# Patient Record
Sex: Female | Born: 1960 | Race: White | Hispanic: No | Marital: Married | Smoking: Never smoker
Health system: Southern US, Community
[De-identification: ages and names within clinical notes are randomized; demographics above are authoritative.]

## PROBLEM LIST (undated history)

## (undated) DIAGNOSIS — Z90710 Acquired absence of both cervix and uterus: Secondary | ICD-10-CM

## (undated) DIAGNOSIS — Z872 Personal history of diseases of the skin and subcutaneous tissue: Secondary | ICD-10-CM

## (undated) DIAGNOSIS — K219 Gastro-esophageal reflux disease without esophagitis: Secondary | ICD-10-CM

## (undated) DIAGNOSIS — Z9889 Other specified postprocedural states: Secondary | ICD-10-CM

## (undated) DIAGNOSIS — T7840XA Allergy, unspecified, initial encounter: Secondary | ICD-10-CM

## (undated) DIAGNOSIS — Z79890 Hormone replacement therapy (postmenopausal): Secondary | ICD-10-CM

## (undated) DIAGNOSIS — E039 Hypothyroidism, unspecified: Secondary | ICD-10-CM

## (undated) DIAGNOSIS — E079 Disorder of thyroid, unspecified: Secondary | ICD-10-CM

## (undated) HISTORY — DX: Allergy, unspecified, initial encounter: T78.40XA

## (undated) HISTORY — DX: Personal history of diseases of the skin and subcutaneous tissue: Z87.2

## (undated) HISTORY — DX: Gastro-esophageal reflux disease without esophagitis: K21.9

## (undated) HISTORY — DX: Acquired absence of both cervix and uterus: Z90.710

## (undated) HISTORY — DX: Hormone replacement therapy: Z79.890

## (undated) HISTORY — DX: Other specified postprocedural states: Z98.890

## (undated) HISTORY — PX: BREAST SURGERY: SHX581

## (undated) HISTORY — DX: Hypothyroidism, unspecified: E03.9

---

## 1997-01-10 HISTORY — PX: BREAST EXCISIONAL BIOPSY: SUR124

## 1997-05-23 ENCOUNTER — Other Ambulatory Visit: Admission: RE | Admit: 1997-05-23 | Discharge: 1997-05-23 | Payer: Self-pay | Admitting: General Surgery

## 1997-12-26 ENCOUNTER — Other Ambulatory Visit: Admission: RE | Admit: 1997-12-26 | Discharge: 1997-12-26 | Payer: Self-pay | Admitting: Obstetrics & Gynecology

## 1998-12-21 ENCOUNTER — Other Ambulatory Visit: Admission: RE | Admit: 1998-12-21 | Discharge: 1998-12-21 | Payer: Self-pay | Admitting: Obstetrics & Gynecology

## 1998-12-22 ENCOUNTER — Other Ambulatory Visit: Admission: RE | Admit: 1998-12-22 | Discharge: 1998-12-22 | Payer: Self-pay | Admitting: Obstetrics & Gynecology

## 1999-12-30 ENCOUNTER — Other Ambulatory Visit: Admission: RE | Admit: 1999-12-30 | Discharge: 1999-12-30 | Payer: Self-pay | Admitting: Obstetrics & Gynecology

## 2000-12-26 ENCOUNTER — Other Ambulatory Visit: Admission: RE | Admit: 2000-12-26 | Discharge: 2000-12-26 | Payer: Self-pay | Admitting: Obstetrics & Gynecology

## 2009-04-08 HISTORY — PX: ABDOMINAL HYSTERECTOMY: SHX81

## 2013-12-02 ENCOUNTER — Encounter: Payer: Self-pay | Admitting: Emergency Medicine

## 2013-12-02 ENCOUNTER — Emergency Department
Admission: EM | Admit: 2013-12-02 | Discharge: 2013-12-02 | Disposition: A | Discharge status: Home / Self Care | Payer: 59 | Source: Home / Self Care | Attending: Family Medicine | Admitting: Family Medicine

## 2013-12-02 DIAGNOSIS — H6121 Impacted cerumen, right ear: Secondary | ICD-10-CM

## 2013-12-02 HISTORY — DX: Disorder of thyroid, unspecified: E07.9

## 2013-12-02 MED ORDER — NEOMYCIN-POLYMYXIN-HC 3.5-10000-1 OT SUSP: 4.0000 [drp] | Freq: Three times a day (TID) | OTIC | Status: DC

## 2013-12-02 NOTE — ED Notes (Signed)
Rt ear cerumen impaction x 2 weeks

## 2013-12-02 NOTE — ED Provider Notes (Signed)
CSN: [id number]     Arrival date & time 12/02/13  1001 History   First MD Initiated Contact with Patient 12/02/13 1033     Chief Complaint  Patient presents with  . Cerumen Impaction      HPI Comments: Patient reports that her right ear has felt clogged for two weeks without pain.  She has a history of cerumen impaction.  Patient is a 53 y.o. female presenting with plugged ear sensation. The history is provided by the patient.  Ear Fullness This is a recurrent problem. Episode onset: 2 weeks ago. The problem occurs constantly. The problem has not changed since onset.Associated symptoms comments: none. Nothing aggravates the symptoms. Nothing relieves the symptoms. She has tried nothing for the symptoms.    Past Medical History  Diagnosis Date  . Thyroid disease    Past Surgical History  Procedure Laterality Date  . Abdominal hysterectomy     Family History  Problem Relation Age of Onset  . Stroke Mother   . Diabetes Father    History  Substance Use Topics  . Smoking status: Never Smoker   . Smokeless tobacco: Not on file  . Alcohol Use: Yes   OB History    No data available     Review of Systems  Constitutional: Negative for fever, chills and fatigue.  HENT: Positive for hearing loss. Negative for congestion, ear discharge and ear pain.   All other systems reviewed and are negative.   Allergies  Review of patient's allergies indicates no known allergies.  Home Medications   Prior to Admission medications   Medication Sig Start Date End Date Taking? Authorizing Provider  levothyroxine (SYNTHROID, LEVOTHROID) 75 MCG tablet Take 75 mcg by mouth daily before breakfast.   Yes Historical Provider, MD  neomycin-polymyxin-hydrocortisone (CORTISPORIN) 3.5-10000-1 otic suspension Place 4 drops into the right ear 3 (three) times daily. (Rx void after 12/10/13) 12/02/13   Kandra Nicolas, MD   BP 131/82 mmHg  Pulse 79  Temp(Src) 98.1 F (36.7 C) (Oral)  Ht 5\' 8"  (1.727 m)   Wt 171 lb (77.565 kg)  BMI 26.01 kg/m2  SpO2 99% Physical Exam Nursing notes and Vital Signs reviewed. Appearance:  Patient appears healthy, stated age, and in no acute distress Eyes:  Pupils are equal, round, and reactive to light and accomodation.  Extraocular movement is intact.  Conjunctivae are not inflamed  Ears:   Left canal and tympanic membrane normal.  Right external auditory canal occluded with cerumen.  Post lavage, right canal and tympanic membrane normal. Nose:   Normal turbinates.  No sinus tenderness.  Pharynx:  Normal Neck:  Supple.  No adenopathy    ED Course  Procedures  Lavage right ear without difficulty     MDM   1. Right ear impacted cerumen    If develops ear drainage or pain, add Cortisporin Otic suspension    Kandra Nicolas, MD 12/03/13 [phone]

## 2013-12-02 NOTE — Discharge Instructions (Signed)
Cerumen Impaction °A cerumen impaction is when the wax in your ear forms a plug. This plug usually causes reduced hearing. Sometimes it also causes an earache or dizziness. Removing a cerumen impaction can be difficult and painful. The wax sticks to the ear canal. The canal is sensitive and bleeds easily. If you try to remove a heavy wax buildup with a cotton tipped swab, you may push it in further. °Irrigation with water, suction, and small ear curettes may be used to clear out the wax. If the impaction is fixed to the skin in the ear canal, ear drops may be needed for a few days to loosen the wax. People who build up a lot of wax frequently can use ear wax removal products available in your local drugstore. °SEEK MEDICAL CARE IF:  °You develop an earache, increased hearing loss, or marked dizziness. °Document Released: 02/04/2004 Document Revised: 03/21/2011 Document Reviewed: 03/26/2009 °ExitCare® Patient Information ©2015 [employer]. This information is not intended to replace advice given to you by your health care provider. Make sure you discuss any questions you have with your health care provider. ° °

## 2013-12-13 LAB — HM MAMMOGRAPHY

## 2014-09-08 ENCOUNTER — Ambulatory Visit (INDEPENDENT_AMBULATORY_CARE_PROVIDER_SITE_OTHER): Payer: 59 | Admitting: Osteopathic Medicine

## 2014-09-08 ENCOUNTER — Encounter: Payer: Self-pay | Admitting: Osteopathic Medicine

## 2014-09-08 VITALS — BP 122/78 | HR 67 | Ht 68.0 in | Wt 172.0 lb

## 2014-09-08 DIAGNOSIS — Z90710 Acquired absence of both cervix and uterus: Secondary | ICD-10-CM

## 2014-09-08 DIAGNOSIS — E039 Hypothyroidism, unspecified: Secondary | ICD-10-CM

## 2014-09-08 DIAGNOSIS — Z9889 Other specified postprocedural states: Secondary | ICD-10-CM

## 2014-09-08 DIAGNOSIS — Z872 Personal history of diseases of the skin and subcutaneous tissue: Secondary | ICD-10-CM

## 2014-09-08 DIAGNOSIS — Z79899 Other long term (current) drug therapy: Secondary | ICD-10-CM

## 2014-09-08 DIAGNOSIS — Z13220 Encounter for screening for lipoid disorders: Secondary | ICD-10-CM

## 2014-09-08 DIAGNOSIS — Z0000 Encounter for general adult medical examination without abnormal findings: Secondary | ICD-10-CM | POA: Diagnosis not present

## 2014-09-08 DIAGNOSIS — Z79890 Hormone replacement therapy (postmenopausal): Secondary | ICD-10-CM

## 2014-09-08 HISTORY — DX: Acquired absence of both cervix and uterus: Z90.710

## 2014-09-08 HISTORY — DX: Personal history of diseases of the skin and subcutaneous tissue: Z87.2

## 2014-09-08 HISTORY — DX: Hormone replacement therapy: Z79.890

## 2014-09-08 HISTORY — DX: Hypothyroidism, unspecified: E03.9

## 2014-09-08 HISTORY — DX: Other specified postprocedural states: Z98.890

## 2014-09-08 LAB — COMPLETE METABOLIC PANEL WITH GFR
ALT: 39 U/L — AB (ref 6–29)
AST: 25 U/L (ref 10–35)
Albumin: 4.7 g/dL (ref 3.6–5.1)
Alkaline Phosphatase: 75 U/L (ref 33–130)
BUN: 13 mg/dL (ref 7–25)
CHLORIDE: 103 mmol/L (ref 98–110)
CO2: 25 mmol/L (ref 20–31)
CREATININE: 0.67 mg/dL (ref 0.50–1.05)
Calcium: 9.5 mg/dL (ref 8.6–10.4)
GFR, Est African American: >89 mL/min (ref >=60)
GFR, Est Non African American: >89 mL/min (ref >=60)
Glucose, Bld: 104 mg/dL — ABNORMAL HIGH (ref 65–99)
Potassium: 4.5 mmol/L (ref 3.5–5.3)
Sodium: 143 mmol/L (ref 135–146)
Total Bilirubin: 1.9 mg/dL — ABNORMAL HIGH (ref 0.2–1.2)
Total Protein: 7.2 g/dL (ref 6.1–8.1)

## 2014-09-08 LAB — TSH: TSH: 0.542 u[IU]/mL (ref 0.350–4.500)

## 2014-09-08 LAB — LIPID PANEL
Cholesterol: 204 mg/dL — ABNORMAL HIGH (ref 125–200)
HDL: 52 mg/dL (ref >=46)
LDL CALC: 129 mg/dL (ref <130)
Total CHOL/HDL Ratio: 3.9 Ratio (ref <=5.0)
Triglycerides: 113 mg/dL (ref <150)
VLDL: 23 mg/dL (ref <30)

## 2014-09-08 NOTE — Progress Notes (Signed)
HPI: Teresa Sullivan is a 54 y.o. female who presents to [place]  today for chief complaint of: PHYSICAL EXAM, preventive care reviewed as below  Hypothyroid - previously managed by OBGYN, last TSH WNL 12/2013  Menopause - symptoms well controlled on estrogen patch, follows wit OBGYN  Breast - Hx lumpectomy and cyst drainage, no breast changes or complaints today. Follows with OBGYN  Past medical, social and family history reviewed: Past Medical History  Diagnosis Date  . Thyroid disease    Past Surgical History  Procedure Laterality Date  . Breast surgery    . Abdominal hysterectomy  04-08-09    total hysterectomy Dr Neale Burly   Social History  Substance Use Topics  . Smoking status: Never Smoker   . Smokeless tobacco: Not on file  . Alcohol Use: Yes     Comment: 1 x week   Family History  Problem Relation Age of Onset  . Stroke Mother   . Aneurysm Mother   . Diabetes Father   . Diabetes Paternal Aunt     Current Outpatient Prescriptions  Medication Sig Dispense Refill  . estradiol (VIVELLE-DOT) 0.025 MG/24HR Place 1 patch onto the skin 2 (two) times a week.    . levothyroxine (SYNTHROID, LEVOTHROID) 75 MCG tablet Take 75 mcg by mouth daily before breakfast.     No current facility-administered medications for this visit.   No Known Allergies   Review of Systems: CONSTITUTIONAL: Neg fever/chills, no unintentional weight changes HEAD/EYES/EARS/NOSE/THROAT: No headache/vision change or hearing change, no sore throat CARDIAC: No chest pain/pressure/palpitations, no orthopnea RESPIRATORY: No cough/shortness of breath/wheeze GASTROINTESTINAL: No nausea/vomiting/abdominal pain/blood in stool/diarrhea/constipation MUSCULOSKELETAL: No myalgia/arthralgia GENITOURINARY: No incontinence, No abnormal genital bleeding/discharge SKIN: No rash/wounds/concerning lesions HEM/ONC: No easy bruising/bleeding, no abnormal lymph  node ENDOCRINE: No polyuria/polydipsia/polyphagia, no heat/cold intolerance  NEUROLOGIC: No weakness/dizzines/slurred speech PSYCHIATRIC: No concerns with depression/anxiety or sleep problems    Exam:  BP 122/78 mmHg  Pulse 67  Ht 5\' 8"  (1.727 m)  Wt 172 lb (78.019 kg)  BMI 26.16 kg/m2  SpO2 97% Constitutional: VSS, see above. General Appearance: alert, well-developed, well-nourished, NAD Eyes: Normal lids and conjunctive, non-icteric sclera, PERRLA Ears, Nose, Mouth, Throat: Normal external inspection ears/nares/mouth/lips/gums, Normal TM bilaterally, MMM, posterior pharynx without erythema/exudate Neck: No masses, trachea midline. No thyroid enlargement/tenderness/mass appreciated Respiratory: Normal respiratory effort. No dullness/hyper-resonance to percussion. Breath sounds normal, no wheeze/rhonchi/rales Cardiovascular: S1/S2 normal, no murmur/rub/gallop auscultated. No carotid bruit or JVD. No abdominal aortic bruit. Pedal pulse II/IV bilaterally DP and PT. No lower extremity edema. Gastrointestinal: Nontender, no masses. No hepatomegaly, no splenomegaly. No hernia appreciated. Rectal exam deferred.  Musculoskeletal: Gait normal. No clubbing/cyanosis of digits.  Neurological: No cranial nerve deficit on limited exam. Motor and sensation intact and symmetric Psychiatric: Normal judgment/insight. Normal mood and affect. Oriented x3.      ASSESSMENT/PLAN:  Visit for annual health examination  Hypothyroidism, unspecified hypothyroidism type - Plan: TSH  Hormone replacement therapy (postmenopausal)  Lipid screening - Plan: Lipid panel  Medication management - Plan: COMPLETE METABOLIC PANEL WITH GFR    Patient presents paperwork for insurance, will fill this out and fax once results are available. Patient has no additional complaints, she is instructed to fill records release so that we can obtain previous records, she has chiefly seen OB/GYN in the past for medical conditions  as noted above. Patient advised to return every 6 months for thyroid testing to monitor levothyroxine, annually for routine physical exam and to  discuss preventive care. Advised twll review records and determine need for Mammo and Colonoscopy however pt states she is not due for these (last mammo 12/2013, last colonosopy 2011 normal)   FEMALE PREVENTIVE CARE  ANNUAL SCREENING/COUNSELING Tobacco -  NEVER Alcohol - OCCASIONAL/SOCIAL Diet/Exercise - HEALTHY HABITS DISCUSSED, HEALTHY DIET, WALKING Sexual Health/STI - NO CONCERNS Depression - PQH2 NEGATIVE Domestic violence - NO CONCERNS HTN - SEE VITALS Vaccination status - UTD PER PATIENT  INFECTIOUS DISEASE SCREENING HIV - all adults 15-65 - NO NEED - PT STATES NEG GC/CT - sexually active - NO NEED HepC - born 80-1966 - NO NEED - PT STATES NEG  DISEASE SCREENING Lipid - age 34+ if risk - WILL CHECK TODAY DM2 - overweight age 34-70 or other risk factors - SEE FBG ON CMP Osteoporosis - age 88+ or one sooner if risk - NO NEED  CANCER SCREENING Cervical - Pap q3 yr age 32+, Pap + HPV q5y age 45+ - S/P HYSTERECTOMY FOR ENDOMETRIOSIS Breast - Mammo age 66+ (C) and biennial age 20-75 (A) - S/P LUMPECTOMY 1999, NORMAL  Lung - low dose CT Chest age 38-80 - NO NEED Colon - age 24+ or 55 years of age prior to [place] Dx - NORMAL 2011  VACCINATION Influenza - annual - RECOMMEND  HPV - age <15yo - NO NEED Zoster - age 34+ - NO NEED Pneumonia - age 88+ sooner if risk (DM, other) - NO NEED Td every 10 years - RECOMENDED  OTHER Fall - exercise and Vit D age 41+ -  Consider ASA - age 37-59 -

## 2014-09-09 ENCOUNTER — Telehealth: Payer: Self-pay | Admitting: *Deleted

## 2014-09-09 NOTE — Telephone Encounter (Signed)
Left message that I would fax the physical verification form. And that Hgb a1c would need  To be checked & that @ her convince please call to have that scheduled. Also I am placing form @  front office in a envelope.

## 2014-10-16 ENCOUNTER — Other Ambulatory Visit: Payer: Self-pay

## 2014-11-07 ENCOUNTER — Other Ambulatory Visit: Payer: Self-pay

## 2014-11-07 DIAGNOSIS — Z1231 Encounter for screening mammogram for malignant neoplasm of breast: Secondary | ICD-10-CM

## 2014-11-12 ENCOUNTER — Other Ambulatory Visit: Payer: Self-pay | Admitting: Osteopathic Medicine

## 2014-11-12 DIAGNOSIS — R7301 Impaired fasting glucose: Secondary | ICD-10-CM

## 2014-12-12 ENCOUNTER — Ambulatory Visit
Admission: RE | Admit: 2014-12-12 | Discharge: 2014-12-12 | Disposition: A | Discharge status: Home / Self Care | Payer: 59 | Source: Ambulatory Visit

## 2014-12-12 DIAGNOSIS — Z1231 Encounter for screening mammogram for malignant neoplasm of breast: Secondary | ICD-10-CM

## 2014-12-25 ENCOUNTER — Telehealth: Payer: Self-pay

## 2014-12-25 MED ORDER — ESTRADIOL 0.025 MG/24HR TD PTTW: 1.0000 | MEDICATED_PATCH | TRANSDERMAL | Status: DC

## 2014-12-25 MED ORDER — LEVOTHYROXINE SODIUM 75 MCG PO TABS: 75.0000 ug | ORAL_TABLET | Freq: Every day | ORAL | Status: DC

## 2014-12-25 NOTE — Telephone Encounter (Signed)
Patient request refill for Levothryoxine 75 mcg and Estradiol 0.025 mg. Patient was advised to schedule a follow up appt in February. Lilith Solana,CMA

## 2015-03-21 ENCOUNTER — Other Ambulatory Visit: Payer: Self-pay | Admitting: Osteopathic Medicine

## 2015-03-23 ENCOUNTER — Other Ambulatory Visit: Payer: Self-pay

## 2015-03-23 ENCOUNTER — Telehealth: Payer: Self-pay

## 2015-03-23 DIAGNOSIS — R7301 Impaired fasting glucose: Secondary | ICD-10-CM

## 2015-03-23 DIAGNOSIS — E039 Hypothyroidism, unspecified: Secondary | ICD-10-CM

## 2015-03-23 NOTE — Telephone Encounter (Signed)
PATIENT CALLED REQUESTED A REFILL FOR LEVOTHYROXINE. PATIENT WAS ADVISED THAT A FOLLOW UP APPOINTMENT IS NEEDED AND WAS TRANSFERRED TO SCHEDULING TO SCHEDULE A FOLLOW UP APPOINTMENT.TSH  LABS WERE ORDERED TODAY. Rhonda Cunningham,CMA

## 2015-04-01 ENCOUNTER — Ambulatory Visit: Payer: 59 | Admitting: Osteopathic Medicine

## 2015-04-02 LAB — TSH: TSH: 1.13 m[IU]/L

## 2015-04-02 LAB — HEMOGLOBIN A1C
Hgb A1c MFr Bld: 5.7 % — ABNORMAL HIGH (ref <5.7)
Mean Plasma Glucose: 117 mg/dL — ABNORMAL HIGH (ref <117)

## 2015-04-02 MED ORDER — LEVOTHYROXINE SODIUM 75 MCG PO TABS: 75.0000 ug | ORAL_TABLET | Freq: Every day | ORAL | Status: DC

## 2015-04-02 NOTE — Addendum Note (Signed)
Addended by: Maryla Morrow on: 04/02/2015 08:50 AM   Modules accepted: Orders

## 2015-04-20 ENCOUNTER — Other Ambulatory Visit: Payer: Self-pay | Admitting: Osteopathic Medicine

## 2015-04-28 ENCOUNTER — Telehealth: Payer: Self-pay | Admitting: Osteopathic Medicine

## 2015-04-28 NOTE — Telephone Encounter (Signed)
I called pt and left a message for her to f/u on Thyroid with Dr.Alexander

## 2015-05-07 ENCOUNTER — Telehealth: Payer: Self-pay

## 2015-05-07 NOTE — Telephone Encounter (Signed)
Patient request refill for Estradiol 0.025 mg. Is it ok to refill? Jovanne Riggenbach,CMA

## 2015-05-08 MED ORDER — ESTRADIOL 0.025 MG/24HR TD PTTW: 1.0000 | MEDICATED_PATCH | TRANSDERMAL | Status: DC

## 2015-05-08 NOTE — Telephone Encounter (Signed)
Sure, can refill for 6 months

## 2015-05-08 NOTE — Telephone Encounter (Signed)
SPOKE TO PATIENT ADVISED HER THAT ESTRADIOL 0.025 8 PATCHES WITH 6 REFILLS WERE SENT TO PHARMACY. Rhonda Cunningham,CMA

## 2015-08-26 ENCOUNTER — Encounter: Payer: 59 | Admitting: Osteopathic Medicine

## 2015-09-02 ENCOUNTER — Ambulatory Visit (INDEPENDENT_AMBULATORY_CARE_PROVIDER_SITE_OTHER): Payer: 59 | Admitting: Osteopathic Medicine

## 2015-09-02 ENCOUNTER — Encounter: Payer: Self-pay | Admitting: Osteopathic Medicine

## 2015-09-02 VITALS — BP 113/74 | HR 76 | Ht 68.0 in | Wt 171.0 lb

## 2015-09-02 DIAGNOSIS — E039 Hypothyroidism, unspecified: Secondary | ICD-10-CM | POA: Diagnosis not present

## 2015-09-02 DIAGNOSIS — Z780 Asymptomatic menopausal state: Secondary | ICD-10-CM

## 2015-09-02 DIAGNOSIS — Z0000 Encounter for general adult medical examination without abnormal findings: Secondary | ICD-10-CM | POA: Diagnosis not present

## 2015-09-02 DIAGNOSIS — Z79890 Hormone replacement therapy (postmenopausal): Secondary | ICD-10-CM | POA: Diagnosis not present

## 2015-09-02 DIAGNOSIS — R17 Unspecified jaundice: Secondary | ICD-10-CM

## 2015-09-02 DIAGNOSIS — R748 Abnormal levels of other serum enzymes: Secondary | ICD-10-CM

## 2015-09-02 LAB — CBC WITH DIFFERENTIAL/PLATELET
BASOS PCT: 1 %
Basophils Absolute: 50 cells/uL (ref 0–200)
EOS PCT: 2 %
Eosinophils Absolute: 100 cells/uL (ref 15–500)
HCT: 40.8 % (ref 35.0–45.0)
Hemoglobin: 14 g/dL (ref 11.7–15.5)
Lymphocytes Relative: 29 %
Lymphs Abs: 1450 cells/uL (ref 850–3900)
MCH: 31.1 pg (ref 27.0–33.0)
MCHC: 34.3 g/dL (ref 32.0–36.0)
MCV: 90.7 fL (ref 80.0–100.0)
MONOS PCT: 9 %
MPV: 10 fL (ref 7.5–12.5)
Monocytes Absolute: 450 cells/uL (ref 200–950)
Neutro Abs: 2950 cells/uL (ref 1500–7800)
Neutrophils Relative %: 59 %
PLATELETS: 275 10*3/uL (ref 140–400)
RBC: 4.5 MIL/uL (ref 3.80–5.10)
RDW: 12.5 % (ref 11.0–15.0)
WBC: 5 10*3/uL (ref 3.8–10.8)

## 2015-09-02 LAB — COMPLETE METABOLIC PANEL WITH GFR
ALT: 49 U/L — AB (ref 6–29)
AST: 29 U/L (ref 10–35)
Albumin: 4.7 g/dL (ref 3.6–5.1)
Alkaline Phosphatase: 66 U/L (ref 33–130)
BUN: 14 mg/dL (ref 7–25)
CHLORIDE: 102 mmol/L (ref 98–110)
CO2: 26 mmol/L (ref 20–31)
CREATININE: 0.79 mg/dL (ref 0.50–1.05)
Calcium: 10 mg/dL (ref 8.6–10.4)
GFR, Est African American: >89 mL/min (ref >=60)
GFR, Est Non African American: 85 mL/min (ref >=60)
Glucose, Bld: 101 mg/dL — ABNORMAL HIGH (ref 65–99)
Potassium: 4.6 mmol/L (ref 3.5–5.3)
Sodium: 139 mmol/L (ref 135–146)
Total Bilirubin: 2.1 mg/dL — ABNORMAL HIGH (ref 0.2–1.2)
Total Protein: 7.4 g/dL (ref 6.1–8.1)

## 2015-09-02 LAB — LIPID PANEL
Cholesterol: 244 mg/dL — ABNORMAL HIGH (ref 125–200)
HDL: 66 mg/dL (ref >=46)
LDL Cholesterol: 155 mg/dL — ABNORMAL HIGH (ref <130)
TRIGLYCERIDES: 115 mg/dL (ref <150)
Total CHOL/HDL Ratio: 3.7 Ratio (ref <=5.0)
VLDL: 23 mg/dL (ref <30)

## 2015-09-02 LAB — TSH: TSH: 1.1 mIU/L

## 2015-09-02 NOTE — Patient Instructions (Signed)
Recommended health maintenance:  Mammogram 12/2015  Tetanus vaccine updated?  Flu shot annually  Colonoscopy - ask previous GI doctor when you are due for follow-up  Estrogen - ok to continue or can try coming off this, it's up to you!   We will call you with lab results in the next 1 - 2 days, and will leave your form up front for you to pick up once we have completed it.

## 2015-09-02 NOTE — Progress Notes (Signed)
HPI: Teresa Sullivan is a 55 y.o. female who presents to [place]  today for chief complaint of: PHYSICAL EXAM, preventive care reviewed as below  Hypothyroid - previously managed by OBGYN, last TSH WNL 12/2013, 11 months ago and 5 months ago   Menopause - symptoms well controlled on estrogen patch, has been on this since TH/BSO, previously with OBGYN, requests refill meds x 1 year.   Breast - Hx lumpectomy and cyst drainage, no breast changes or complaints today. Mammo 12/2014  Past medical, social and family history reviewed: Past Medical History:  Diagnosis Date  . Thyroid disease    Past Surgical History:  Procedure Laterality Date  . ABDOMINAL HYSTERECTOMY  04-08-09   total hysterectomy Dr Neale Burly  . BREAST SURGERY     Social History  Substance Use Topics  . Smoking status: Never Smoker  . Smokeless tobacco: Never Used  . Alcohol use Yes     Comment: 1 x week   Family History  Problem Relation Age of Onset  . Stroke Mother   . Aneurysm Mother   . Diabetes Father   . Diabetes Paternal Aunt     Current Outpatient Prescriptions  Medication Sig Dispense Refill  . estradiol (VIVELLE-DOT) 0.025 MG/24HR Place 1 patch onto the skin 2 (two) times a week. 8 patch 6  . levothyroxine (SYNTHROID, LEVOTHROID) 75 MCG tablet Take 1 tablet (75 mcg total) by mouth daily before breakfast. 90 tablet 1   No current facility-administered medications for this visit.    No Known Allergies   Review of Systems: CONSTITUTIONAL: Neg fever/chills, no unintentional weight changes HEAD/EYES/EARS/NOSE/THROAT: No headache/vision change or hearing change, no sore throat CARDIAC: No chest pain/pressure/palpitations, no orthopnea RESPIRATORY: No cough/shortness of breath/wheeze GASTROINTESTINAL: No nausea/vomiting/abdominal pain/blood in stool/diarrhea/constipation MUSCULOSKELETAL: No myalgia/arthralgia GENITOURINARY: No incontinence, No abnormal  genital bleeding/discharge SKIN: No rash/wounds/concerning lesions HEM/ONC: No easy bruising/bleeding, no abnormal lymph node ENDOCRINE: No polyuria/polydipsia/polyphagia, no heat/cold intolerance  NEUROLOGIC: No weakness/dizzines/slurred speech PSYCHIATRIC: No concerns with depression/anxiety or sleep problems    Exam:  BP 113/74   Pulse 76   Ht 5\' 8"  (1.727 m)   Wt 171 lb (77.6 kg)   BMI 26.00 kg/m  Constitutional: VSS, see above. General Appearance: alert, well-developed, well-nourished, NAD Eyes: Normal lids and conjunctive, non-icteric sclera, PERRLA Ears, Nose, Mouth, Throat: Normal external inspection ears/nares/mouth/lips/gums, Normal TM bilaterally, MMM, posterior pharynx without erythema/exudate Neck: No masses, trachea midline. No thyroid enlargement/tenderness/mass appreciated Respiratory: Normal respiratory effort. No dullness/hyper-resonance to percussion. Breath sounds normal, no wheeze/rhonchi/rales Cardiovascular: S1/S2 normal, no murmur/rub/gallop auscultated. No carotid bruit or JVD. No abdominal aortic bruit. No lower extremity edema. Gastrointestinal: Nontender, no masses. No hepatomegaly, no splenomegaly. No hernia appreciated. Rectal exam deferred.  Musculoskeletal: Gait normal. No clubbing/cyanosis of digits.  Neurological: No cranial nerve deficit on limited exam. Motor and sensation intact and symmetric Psychiatric: Normal judgment/insight. Normal mood and affect. Oriented x3.      ASSESSMENT/PLAN:  Annual physical exam - Plan: CBC with Differential/Platelet, COMPLETE METABOLIC PANEL WITH GFR, Hemoglobin A1c, Lipid panel, TSH, VITAMIN D 25 Hydroxy (Vit-D Deficiency, Fractures)  Hypothyroidism, unspecified hypothyroidism type - Stable dose several years, ok to check annually - Plan: TSH  Hormone replacement therapy (postmenopausal) - discussed risks vs benefits for women on only estrogen (see UpToDate Graphic [id number] Version 5.0), option to trial medication  holiday  Postmenopausal - Plan: VITAMIN D 25 Hydroxy (Vit-D Deficiency, Fractures)    Patient presents paperwork for insurance, will fill  this out and fax once results are available.   FEMALE PREVENTIVE CARE updated 09/02/15   ANNUAL SCREENING/COUNSELING Tobacco -  NEVER Alcohol - OCCASIONAL/SOCIAL Diet/Exercise - HEALTHY HABITS DISCUSSED, HEALTHY DIET, WALKING Sexual Health/STI - NO CONCERNS Depression - PQH2 NEGATIVE Domestic violence - NO CONCERNS HTN - SEE VITALS Vaccination status - UTD PER PATIENT  INFECTIOUS DISEASE SCREENING HIV - all adults 15-65 - NO NEED - PT STATES NEG GC/CT - sexually active - NO NEED HepC - born 21-1966 - NO NEED - PT STATES NEG  DISEASE SCREENING Lipid - age 39+ if risk - WILL CHECK TODAY DM2 - overweight age 27-70 or other risk factors - SEE FBG ON CMP Osteoporosis - age 24+ or one sooner if risk - NO NEED  CANCER SCREENING Cervical - Pap q3 yr age 30+, Pap + HPV q5y age 3+ - S/P HYSTERECTOMY FOR ENDOMETRIOSIS Breast - Mammo age 58+ (C) and biennial age 13-75 (A) - S/P LUMPECTOMY 1999, NORMAL  Lung - low dose CT Chest age 5-80 - NO NEED Colon - age 81+ or 56 years of age prior to [place] Dx - NORMAL 2011  VACCINATION Influenza - annual - RECOMMEND  DECLINED HPV - age <4yo - NO NEED Zoster - age 8+ - NO NEED Pneumonia - age 63+ sooner if risk (DM, other) - NO NEED Td every 10 years - RECOMMENDED - PT WILL CHECK ON HER RECORDS  OTHER Fall - exercise and Vit D age 24+ - NO NEED Consider ASA - age 60-59 - NO NEED

## 2015-09-03 LAB — HEMOGLOBIN A1C
Hgb A1c MFr Bld: 5.4 % (ref <5.7)
MEAN PLASMA GLUCOSE: 108 mg/dL

## 2015-09-03 LAB — VITAMIN D 25 HYDROXY (VIT D DEFICIENCY, FRACTURES): VIT D 25 HYDROXY: 25 ng/mL — AB (ref 30–100)

## 2015-09-03 NOTE — Addendum Note (Signed)
Addended by: Maryla Morrow on: 09/03/2015 01:00 PM   Modules accepted: Orders

## 2015-09-18 ENCOUNTER — Encounter: Payer: Self-pay | Admitting: Osteopathic Medicine

## 2015-10-22 ENCOUNTER — Other Ambulatory Visit: Payer: Self-pay | Admitting: Osteopathic Medicine

## 2015-10-22 DIAGNOSIS — E039 Hypothyroidism, unspecified: Secondary | ICD-10-CM

## 2015-11-12 ENCOUNTER — Other Ambulatory Visit: Payer: Self-pay | Admitting: Osteopathic Medicine

## 2015-11-12 DIAGNOSIS — Z1231 Encounter for screening mammogram for malignant neoplasm of breast: Secondary | ICD-10-CM

## 2015-12-08 ENCOUNTER — Other Ambulatory Visit: Payer: Self-pay | Admitting: Osteopathic Medicine

## 2015-12-16 ENCOUNTER — Ambulatory Visit
Admission: RE | Admit: 2015-12-16 | Discharge: 2015-12-16 | Disposition: A | Discharge status: Home / Self Care | Payer: 59 | Source: Ambulatory Visit | Attending: Osteopathic Medicine | Admitting: Osteopathic Medicine

## 2015-12-16 ENCOUNTER — Ambulatory Visit: Payer: 59

## 2015-12-16 DIAGNOSIS — Z1231 Encounter for screening mammogram for malignant neoplasm of breast: Secondary | ICD-10-CM

## 2015-12-23 ENCOUNTER — Other Ambulatory Visit: Payer: Self-pay | Admitting: Osteopathic Medicine

## 2015-12-23 DIAGNOSIS — E039 Hypothyroidism, unspecified: Secondary | ICD-10-CM

## 2016-01-01 ENCOUNTER — Other Ambulatory Visit: Payer: Self-pay | Admitting: Osteopathic Medicine

## 2016-02-22 ENCOUNTER — Other Ambulatory Visit: Payer: Self-pay | Admitting: Osteopathic Medicine

## 2016-03-23 ENCOUNTER — Other Ambulatory Visit: Payer: Self-pay | Admitting: Osteopathic Medicine

## 2016-04-22 ENCOUNTER — Other Ambulatory Visit: Payer: Self-pay | Admitting: Osteopathic Medicine

## 2016-04-26 ENCOUNTER — Other Ambulatory Visit: Payer: Self-pay

## 2016-04-26 MED ORDER — ESTRADIOL 0.025 MG/24HR TD PTTW: MEDICATED_PATCH | TRANSDERMAL | 0 refills | Status: DC

## 2016-05-24 LAB — LIPID PANEL
Cholesterol: 211 — AB (ref 0–200)
HDL: 58 (ref 35–70)
LDL Cholesterol: 131
TRIGLYCERIDES: 109 (ref 40–160)

## 2016-05-24 LAB — BASIC METABOLIC PANEL
BUN: 13 (ref 4–21)
CREATININE: 0.8 (ref 0.5–1.1)
GFR CALC NON AF AMER: 83
GLUCOSE: 98
Potassium: 4.7 (ref 3.4–5.3)
Sodium: 140 (ref 137–147)

## 2016-05-24 LAB — CBC AND DIFFERENTIAL
HCT: 40 (ref 36–46)
Hemoglobin: 13.9 (ref 12.0–16.0)
PLATELETS: 295 (ref 150–399)
WBC: 4.7

## 2016-05-24 LAB — HEPATIC FUNCTION PANEL
ALT: 35 (ref 7–35)
AST: 23 (ref 13–35)
Alkaline Phosphatase: 69 (ref 25–125)
BILIRUBIN, TOTAL: 1.2

## 2016-05-24 LAB — HEMOGLOBIN A1C: Hemoglobin A1C: 5.2

## 2016-05-24 LAB — TSH: TSH: 1.02 (ref 0.41–5.90)

## 2016-09-05 ENCOUNTER — Encounter: Payer: Self-pay | Admitting: Osteopathic Medicine

## 2016-09-05 ENCOUNTER — Ambulatory Visit (INDEPENDENT_AMBULATORY_CARE_PROVIDER_SITE_OTHER): Payer: 59 | Admitting: Osteopathic Medicine

## 2016-09-05 VITALS — BP 110/75 | HR 72 | Ht 68.5 in | Wt 166.0 lb

## 2016-09-05 DIAGNOSIS — Z13220 Encounter for screening for lipoid disorders: Secondary | ICD-10-CM

## 2016-09-05 DIAGNOSIS — Z90710 Acquired absence of both cervix and uterus: Secondary | ICD-10-CM

## 2016-09-05 DIAGNOSIS — Z9889 Other specified postprocedural states: Secondary | ICD-10-CM

## 2016-09-05 DIAGNOSIS — H6983 Other specified disorders of Eustachian tube, bilateral: Secondary | ICD-10-CM

## 2016-09-05 DIAGNOSIS — Z780 Asymptomatic menopausal state: Secondary | ICD-10-CM

## 2016-09-05 DIAGNOSIS — Z0000 Encounter for general adult medical examination without abnormal findings: Secondary | ICD-10-CM | POA: Diagnosis not present

## 2016-09-05 DIAGNOSIS — E039 Hypothyroidism, unspecified: Secondary | ICD-10-CM

## 2016-09-05 DIAGNOSIS — Z79890 Hormone replacement therapy (postmenopausal): Secondary | ICD-10-CM | POA: Diagnosis not present

## 2016-09-05 MED ORDER — LEVOTHYROXINE SODIUM 75 MCG PO TABS: 75.0000 ug | ORAL_TABLET | Freq: Every day | ORAL | 3 refills | Status: DC

## 2016-09-05 MED ORDER — FLUTICASONE PROPIONATE 50 MCG/ACT NA SUSP: 2.0000 | Freq: Every day | NASAL | 11 refills | Status: DC

## 2016-09-05 MED ORDER — ESTRADIOL 0.025 MG/24HR TD PTTW: MEDICATED_PATCH | TRANSDERMAL | 3 refills | Status: DC

## 2016-09-05 NOTE — Patient Instructions (Signed)
Eustachian Tube Dysfunction The eustachian tube connects the middle ear to the back of the nose. It regulates air pressure in the middle ear by allowing air to move between the ear and nose. It also helps to drain fluid from the middle ear space. When the eustachian tube does not function properly, air pressure, fluid, or both can build up in the middle ear. Eustachian tube dysfunction can affect one or both ears. What are the causes? This condition happens when the eustachian tube becomes blocked or cannot open normally. This may result from:  Ear infections.  Colds and other upper respiratory infections.  Allergies.  Irritation, such as from cigarette smoke or acid from the stomach coming up into the esophagus (gastroesophageal reflux).  Sudden changes in air pressure, such as from descending in an airplane.  Abnormal growths in the nose or throat, such as nasal polyps, tumors, or enlarged tissue at the back of the throat (adenoids).  What increases the risk? This condition may be more likely to develop in people who smoke and people who are overweight. Eustachian tube dysfunction may also be more likely to develop in children, especially children who have:  Certain birth defects of the mouth, such as cleft palate.  Large tonsils and adenoids.  What are the signs or symptoms? Symptoms of this condition may include:  A feeling of fullness in the ear.  Ear pain.  Clicking or popping noises in the ear.  Ringing in the ear.  Hearing loss.  Loss of balance.  Symptoms may get worse when the air pressure around you changes, such as when you travel to an area of high elevation or fly on an airplane. How is this diagnosed? This condition may be diagnosed based on:  Your symptoms.  A physical exam of your ear, nose, and throat.  Tests, such as those that measure: ? The movement of your eardrum (tympanogram). ? Your hearing (audiometry).  How is this treated? Treatment  depends on the cause and severity of your condition. If your symptoms are mild, you may be able to relieve your symptoms by moving air into ("popping") your ears. If you have symptoms of fluid in your ears, treatment may include:  Decongestants.  Antihistamines.  Nasal sprays or ear drops that contain medicines that reduce swelling (steroids).  In some cases, you may need to have a procedure to drain the fluid in your eardrum (myringotomy). In this procedure, a small tube is placed in the eardrum to:  Drain the fluid.  Restore the air in the middle ear space.  Follow these instructions at home:  Take over-the-counter and prescription medicines only as told by your health care provider.  Use techniques to help pop your ears as recommended by your health care provider. These may include: ? Chewing gum. ? Yawning. ? Frequent, forceful swallowing. ? Closing your mouth, holding your nose closed, and gently blowing as if you are trying to blow air out of your nose.  Do not do any of the following until your health care provider approves: ? Travel to high altitudes. ? Fly in airplanes. ? Work in a pressurized cabin or room. ? Scuba dive.  Keep your ears dry. Dry your ears completely after showering or bathing.  Do not smoke.  Keep all follow-up visits as told by your health care provider. This is important. Contact a health care provider if:  Your symptoms do not go away after treatment.  Your symptoms come back after treatment.  You are   unable to pop your ears.  You have: ? A fever. ? Pain in your ear. ? Pain in your head or neck. ? Fluid draining from your ear.  Your hearing suddenly changes.  You become very dizzy.  You lose your balance. This information is not intended to replace advice given to you by your health care provider. Make sure you discuss any questions you have with your health care provider. Document Released: 01/23/2015 Document Revised: 06/04/2015  Document Reviewed: 01/15/2014 Elsevier Interactive Patient Education  2018 [employer]  

## 2016-09-05 NOTE — Progress Notes (Signed)
HPI: Teresa Sullivan is a 56 y.o. female  who presents to [facility] today, 09/05/16,  for chief complaint of:  Chief Complaint  Patient presents with  . Annual Exam    Patient here for annual physical / wellness exam.  See preventive care reviewed as below.   Recent labs reviewed in detail with the patient. Last labs 1 year ago, low vit D, elevated bilirubin (mild), slightly high ALT but at baseline, A1C below prediabetic range but previous in prediabetic range, borderline lipids, normal thyroid. Patient brings labs today that were done through her employer. Back in May 2018, no major concerns. A1c in normal range, bilirubin in normal range.  Additional concerns today include:   Still some foot pain on occasion, dorsum of L foot, shar and intermittent, doesn't stretch or exercise much.   Sinus congestion and occasional feeling of ear fullness. No hearing loss, no vision change/headache, no vertigo, no loss of consciousness, no fall.     Past medical, surgical, social and family history reviewed: Patient Active Problem List   Diagnosis Date Noted  . Hypothyroidism 09/08/2014  . Hormone replacement therapy (postmenopausal) 09/08/2014  . History of cyst of breast 09/08/2014  . History of hysterectomy for benign disease 09/08/2014  . History of colonoscopy 09/08/2014   Past Surgical History:  Procedure Laterality Date  . ABDOMINAL HYSTERECTOMY  04-08-09   total hysterectomy Dr Neale Burly  . BREAST SURGERY     Social History  Substance Use Topics  . Smoking status: Never Smoker  . Smokeless tobacco: Never Used  . Alcohol use Yes     Comment: 1 x week   Family History  Problem Relation Age of Onset  . Stroke Mother   . Aneurysm Mother   . Diabetes Father   . Heart disease Father   . Diabetes Paternal Aunt      Current medication list and allergy/intolerance information reviewed:   Current Outpatient Prescriptions  Medication  Sig Dispense Refill  . estradiol (VIVELLE-DOT) 0.025 MG/24HR PLACE 1 PATCH ONTO THE SKIN TWICE A WEEK. APPOINTMENT NEEDED FOR FURTHER REFILLS. 8 patch 0  . levothyroxine (SYNTHROID, LEVOTHROID) 75 MCG tablet TAKE 1 TABLET(75 MCG) BY MOUTH DAILY BEFORE BREAKFAST 90 tablet 3  . VIVELLE-DOT 0.025 MG/24HR APPLY 1 PATCH EXTERNALLY TO THE SKIN 2 TIMES A WEEK 8 patch 0   No current facility-administered medications for this visit.    No Known Allergies    Review of Systems:  Constitutional:  No  fever, no chills, No recent illness, No unintentional weight changes. No significant fatigue.   HEENT: No  headache, no vision change, no hearing change, No sore throat, No  sinus pressure  Cardiac: No  chest pain, No  pressure, No palpitations  Respiratory:  No  shortness of breath. No  Cough  Gastrointestinal: No  abdominal pain, No  nausea, No  vomiting,  No  blood in stool, No  diarrhea, No  constipation   Musculoskeletal: No new myalgia/arthralgia  Genitourinary: No  abnormal genital bleeding, No abnormal genital discharge  Skin: No  Rash  Hem/Onc: No  easy bruising/bleeding  Endocrine: No cold intolerance,  No heat intolerance. No polyuria/polydipsia/polyphagia   Neurologic: No  weakness, No  dizziness  Psychiatric: No  concerns with depression, No  concerns with anxiety, No sleep problems, No mood problems  Exam:  BP 110/75   Pulse 72   Ht 5' 8.5" (1.74 m)   Wt 166 lb (75.3 kg)  BMI 24.87 kg/m   Constitutional: VS see above. General Appearance: alert, well-developed, well-nourished, NAD  Eyes: Normal lids and conjunctive, non-icteric sclera  Ears, Nose, Mouth, Throat: MMM, Normal external inspection ears/nares/mouth/lips/gums. TM normal bilaterally With mild clear effusion behind left tympanic membrane. Pharynx/tonsils no erythema, no exudate. Nasal mucosa normal.   Neck: No masses, trachea midline. No thyroid enlargement. No tenderness/mass appreciated. No  lymphadenopathy  Respiratory: Normal respiratory effort. no wheeze, no rhonchi, no rales  Cardiovascular: S1/S2 normal, no murmur, no rub/gallop auscultated. RRR. No lower extremity edema.   Gastrointestinal: Nontender, no masses. No hepatomegaly, no splenomegaly. No hernia appreciated. Bowel sounds normal. Rectal exam deferred.   Musculoskeletal: Gait normal. No clubbing/cyanosis of digits.   Neurological: Normal balance/coordination. No tremor. No cranial nerve deficit on limited exam.   Skin: warm, dry, intact. No rash/ulcer. No concerning nevi or subq nodules on limited exam.    Psychiatric: Normal judgment/insight. Normal mood and affect. Oriented x3.     ASSESSMENT/PLAN:   Annual physical exam  Postmenopausal  Hormone replacement therapy (postmenopausal) - Refilled medications, has been on these about 4 or 5 years total. Advised one month of drug holiday, if doing well off medications can stop - Plan: estradiol (VIVELLE-DOT) 0.025 MG/24HR  History of colonoscopy - Need records, patient not sure when she was also follow-up  History of hysterectomy for benign disease - No longer candidate for cervical cancer screening, follow-up as needed  Lipid screening - Good HDL, borderline LDL, discussed lifestyle modifications  Hypothyroidism, unspecified type - Plan: levothyroxine (SYNTHROID, LEVOTHROID) 75 MCG tablet  Dysfunction of both eustachian tubes - Trial Flonase plus or minus second-generation antihistamine plus or minus Sudafed, consider ENT referral or follow-up here if needed   FEMALE PREVENTIVE CARE Updated 09/05/16   ANNUAL SCREENING/COUNSELING  Diet/Exercise - HEALTHY HABITS DISCUSSED TO DECREASE CV RISK History  Smoking Status  . Never Smoker  Smokeless Tobacco  . Never Used   History  Alcohol Use  . Yes    Comment: 1 x week   Depression screen [facility] 2/9 09/05/2016  Decreased Interest 0  Down, Depressed, Hopeless 0  PHQ - 2 Score 0    Domestic  violence concerns - no  HTN SCREENING - SEE [facility]  Sexually active in the past year - Yes with female.  Need/want STI testing today? - no  Concerns about libido or pain with sex? - no  Plans for pregnancy? - n/a, postmenopausal   INFECTIOUS DISEASE SCREENING  HIV - does not need  GC/CT - does not need  HepC - DOB [birth year] - does not need  TB - needs  DISEASE SCREENING  Lipid - does not need  DM2 - does not need  Osteoporosis - women age 41+ - does not need  CANCER SCREENING  Cervical - does not need - s/p hysterectomy for benign disease   Breast - needs  Lung - does not need  Colon - does not need - 2011 per patient, no records available, will request   ADULT VACCINATION  Influenza - annual vaccine recommended - declined today   Td - booster every 10 years - declined  Zoster - option at 5, yes at 60+   PCV13 - was not indicated  PPSV23 - was not indicated  There is no immunization history on file for this patient.   OTHER  Fall - exercise and Vit D age 68+ - does not need  Consider ASA - age 20-59 - does not need  Visit summary with medication list and pertinent instructions was printed for patient to review. All questions at time of visit were answered - patient instructed to contact office with any additional concerns. ER/RTC precautions were reviewed with the patient. Follow-up plan: Return in about 1 year (around 09/05/2017) for [place] - sooner if needed .

## 2016-09-14 ENCOUNTER — Encounter: Payer: Self-pay | Admitting: Osteopathic Medicine

## 2016-10-14 ENCOUNTER — Other Ambulatory Visit: Payer: Self-pay | Admitting: Osteopathic Medicine

## 2016-10-14 DIAGNOSIS — Z1231 Encounter for screening mammogram for malignant neoplasm of breast: Secondary | ICD-10-CM

## 2016-12-19 ENCOUNTER — Ambulatory Visit: Payer: 59

## 2016-12-20 ENCOUNTER — Ambulatory Visit
Admission: RE | Admit: 2016-12-20 | Discharge: 2016-12-20 | Disposition: A | Discharge status: Home / Self Care | Payer: 59 | Source: Ambulatory Visit | Attending: Osteopathic Medicine | Admitting: Osteopathic Medicine

## 2016-12-20 DIAGNOSIS — Z1231 Encounter for screening mammogram for malignant neoplasm of breast: Secondary | ICD-10-CM

## 2016-12-26 ENCOUNTER — Other Ambulatory Visit: Payer: Self-pay | Admitting: Osteopathic Medicine

## 2016-12-26 DIAGNOSIS — E039 Hypothyroidism, unspecified: Secondary | ICD-10-CM

## 2017-01-25 ENCOUNTER — Telehealth: Payer: Self-pay | Admitting: Osteopathic Medicine

## 2017-01-25 MED ORDER — ESTRADIOL 1 MG PO TABS: 1.0000 mg | ORAL_TABLET | Freq: Every day | ORAL | 3 refills | Status: DC

## 2017-01-25 NOTE — Telephone Encounter (Signed)
Pt advised.

## 2017-01-25 NOTE — Telephone Encounter (Signed)
Received notification from [facility] that estrogen patch is no longer covered. Advised estradiol tablets instead. I changed the medicine - can let patient know to expect different medication at the pharmacy

## 2017-02-09 ENCOUNTER — Telehealth: Payer: Self-pay

## 2017-02-09 ENCOUNTER — Other Ambulatory Visit: Payer: Self-pay

## 2017-02-09 DIAGNOSIS — E039 Hypothyroidism, unspecified: Secondary | ICD-10-CM

## 2017-02-09 MED ORDER — LEVOTHYROXINE SODIUM 75 MCG PO TABS: 75.0000 ug | ORAL_TABLET | Freq: Every day | ORAL | 1 refills | Status: DC

## 2017-02-09 NOTE — Telephone Encounter (Signed)
Pt called stating that her ins will cover estradiol transdermal patch, not vivelle dot. Pt does not want to switch to tablet form & is requesting a generic rx for patches. Pt has not p/u rx for estradiol 1 mg at local pharmacy. Pls advise, thanks.

## 2017-02-09 NOTE — Telephone Encounter (Signed)
As per pt, must now use [facility]  for thyroid medication. Spoke to [employer] & cancelled previous rx/refills. Thyroid med sent to [facility]. Pt has been updated of change.

## 2017-02-10 MED ORDER — ESTRADIOL 0.1 MG/24HR TD PTWK: 0.1000 mg | MEDICATED_PATCH | TRANSDERMAL | 5 refills | Status: DC

## 2017-02-10 NOTE — Telephone Encounter (Signed)
Sent - let me know if any problems

## 2017-02-10 NOTE — Telephone Encounter (Signed)
Attempted to contact Pt, call went straight to VM and VM is full.

## 2017-02-13 NOTE — Telephone Encounter (Signed)
Pt advised, she has picked up the new Rx. So far- tolerating it well. No further questions.

## 2017-05-31 LAB — BASIC METABOLIC PANEL
Creatinine: 0.9 (ref 0.5–1.1)
GLUCOSE: 115

## 2017-05-31 LAB — CBC AND DIFFERENTIAL
HEMATOCRIT: 40 (ref 36–46)
HEMOGLOBIN: 13.4 (ref 12.0–16.0)
PLATELETS: 297 (ref 150–399)
WBC: 5

## 2017-07-16 ENCOUNTER — Other Ambulatory Visit: Payer: Self-pay | Admitting: Osteopathic Medicine

## 2017-07-16 DIAGNOSIS — E039 Hypothyroidism, unspecified: Secondary | ICD-10-CM

## 2017-09-08 ENCOUNTER — Other Ambulatory Visit: Payer: Self-pay | Admitting: Osteopathic Medicine

## 2017-09-08 DIAGNOSIS — Z1231 Encounter for screening mammogram for malignant neoplasm of breast: Secondary | ICD-10-CM

## 2017-09-08 DIAGNOSIS — Z79890 Hormone replacement therapy (postmenopausal): Secondary | ICD-10-CM

## 2017-09-12 NOTE — Telephone Encounter (Signed)
[facility] pharmacy requesting med RF for estradiol patch. Pt was due for annual in 08/2017. Pls advise, thanks.

## 2017-09-13 NOTE — Telephone Encounter (Signed)
Can send 30 days, needs appt for annual

## 2017-09-13 NOTE — Telephone Encounter (Signed)
Pt has been updated & will call back to make an appt for annual w/provider.

## 2017-10-16 ENCOUNTER — Other Ambulatory Visit: Payer: Self-pay | Admitting: Osteopathic Medicine

## 2017-10-16 DIAGNOSIS — E039 Hypothyroidism, unspecified: Secondary | ICD-10-CM

## 2017-10-16 NOTE — Telephone Encounter (Signed)
Refill request for L-Thyroxine 75 mcg.   Pt seen at [facility] by Emeterio Reeve.  Please review.

## 2017-10-25 ENCOUNTER — Ambulatory Visit (INDEPENDENT_AMBULATORY_CARE_PROVIDER_SITE_OTHER): Payer: Managed Care, Other (non HMO) | Admitting: Osteopathic Medicine

## 2017-10-25 ENCOUNTER — Encounter: Payer: Self-pay | Admitting: Osteopathic Medicine

## 2017-10-25 VITALS — BP 101/62 | HR 83 | Temp 98.1°F | Wt 167.0 lb

## 2017-10-25 DIAGNOSIS — E559 Vitamin D deficiency, unspecified: Secondary | ICD-10-CM

## 2017-10-25 DIAGNOSIS — Z79890 Hormone replacement therapy (postmenopausal): Secondary | ICD-10-CM

## 2017-10-25 DIAGNOSIS — E039 Hypothyroidism, unspecified: Secondary | ICD-10-CM | POA: Diagnosis not present

## 2017-10-25 DIAGNOSIS — Z0000 Encounter for general adult medical examination without abnormal findings: Secondary | ICD-10-CM | POA: Diagnosis not present

## 2017-10-25 DIAGNOSIS — Z90710 Acquired absence of both cervix and uterus: Secondary | ICD-10-CM

## 2017-10-25 DIAGNOSIS — L578 Other skin changes due to chronic exposure to nonionizing radiation: Secondary | ICD-10-CM

## 2017-10-25 DIAGNOSIS — Z9889 Other specified postprocedural states: Secondary | ICD-10-CM | POA: Diagnosis not present

## 2017-10-25 MED ORDER — ESTRADIOL 0.025 MG/24HR TD PTTW: MEDICATED_PATCH | TRANSDERMAL | 11 refills | Status: DC

## 2017-10-25 MED ORDER — LEVOTHYROXINE SODIUM 75 MCG PO TABS: 75.0000 ug | ORAL_TABLET | Freq: Every day | ORAL | 3 refills | Status: DC

## 2017-10-25 NOTE — Patient Instructions (Signed)
General Preventive Care  Most recent routine screening lipids/other labs: ordered today. Cholesterol and Diabetes screening usually recommended annually.   Everyone should have blood pressure checked once per year.   Tobacco: don't! Alcohol: responsible moderation is ok for most adults - if you have concerns about your alcohol intake, please talk to me! Recreational/Illicit Drugs: don't!  Exercise: as tolerated to reduce risk of cardiovascular disease and diabetes. Strength training will also prevent osteoporosis.   Mental health: if need for mental health care (medicines, counseling, other), or concerns about moods, please let me know!   Sexual health: if need for STD testing, or if concerns with libido/pain problems, please let me know!  Vaccines  Flu vaccine: recommended for almost everyone, every fall (by Halloween! Flu is scary!), especially if you are pregnant, if you have exposure to the public, if you're around young children or the elderly, or if you're around pregnant people.   Shingles vaccine: Shingrix recommended after age 63   Pneumonia vaccines: Prevnar and Pneumovax recommended after age 48, sooner if diabetes, COPD/asthma, others  Tetanus booster: Tdap recommended every 10 years Cancer screenings   Colon cancer screening: recommended for everyone at age 54  Breast cancer screening: mammogram annually after age 57.   Cervical cancer screening: Can stop w/ hysterectomy as long as previous testing was normal.  Infection screenings . HIV: recommended screening at least once age 6-65, or as needed . Gonorrhea/Chlamydia: screening as needed . Hepatitis C: recommended once for anyone born [birth year]-1965 . TB: certain at-risk populations, or depending on work requirements and/or travel history Other . Bone Density Test: recommended for women at age 67, men at age 99, sooner depending on risk factors . Advanced Directive: Living Will and/or Healthcare Power of Attorney  recommended for all adults, regardless of age or health!

## 2017-10-25 NOTE — Progress Notes (Signed)
HPI: Teresa Sullivan is a 57 y.o. female who  has a past medical history of History of colonoscopy (09/08/2014), History of cyst of breast (09/08/2014), History of hysterectomy for benign disease (09/08/2014), Hormone replacement therapy (postmenopausal) (09/08/2014), Hypothyroidism (09/08/2014), and Thyroid disease.  she presents to [facility] today, 10/25/17,  for chief complaint of: Annual physical  Patient here for annual physical / wellness exam.  See preventive care reviewed as below.  Recent labs reviewed in detail with the patient.   Additional concerns today include:  Rough spot of skin on left shoulder blade area, has been there and stable for about a year.    Past medical, surgical, social and family history reviewed:  Patient Active Problem List   Diagnosis Date Noted  . Hypothyroidism 09/08/2014  . Hormone replacement therapy (postmenopausal) 09/08/2014  . History of cyst of breast 09/08/2014  . History of hysterectomy for benign disease 09/08/2014  . History of colonoscopy 09/08/2014    Past Surgical History:  Procedure Laterality Date  . ABDOMINAL HYSTERECTOMY  04-08-09   total hysterectomy Dr Neale Burly  . BREAST EXCISIONAL BIOPSY Left 1999   benign  . BREAST SURGERY      Social History   Tobacco Use  . Smoking status: Never Smoker  . Smokeless tobacco: Never Used  Substance Use Topics  . Alcohol use: Yes    Comment: 1 x week    Family History  Problem Relation Age of Onset  . Stroke Mother   . Aneurysm Mother   . Diabetes Father   . Heart disease Father   . Diabetes Paternal Aunt      Current medication list and allergy/intolerance information reviewed:    Current Outpatient Medications  Medication Sig Dispense Refill  . estradiol (VIVELLE-DOT) 0.025 MG/24HR APPLY 1 PATCH EXTERNALLY TO THE SKIN 2 TIMES A WEEK. Needs annual appt w/PCP for RFs. 8 patch 0  . fluticasone (FLONASE) 50 MCG/ACT nasal spray Place  2 sprays into both nostrils daily. 16 g 11  . levothyroxine (SYNTHROID, LEVOTHROID) 75 MCG tablet Take 1 tablet (75 mcg total) by mouth daily before breakfast. Must schedule OV for further refills 90 tablet 0   No current facility-administered medications for this visit.     No Known Allergies    Review of Systems:  Constitutional:  No  fever, no chills, No recent illness, No unintentional weight changes. No significant fatigue.   HEENT: No  headache, no vision change, no hearing change, No sore throat, No  sinus pressure  Cardiac: No  chest pain, No  pressure, No palpitations, No  Orthopnea  Respiratory:  No  shortness of breath. No  Cough  Gastrointestinal: No  abdominal pain, No  nausea, No  vomiting,  No  blood in stool, No  diarrhea, No  constipation   Musculoskeletal: No new myalgia/arthralgia  Skin: No  Rash, No other wounds, One other concerning lesion  Genitourinary: No  incontinence, No  abnormal genital bleeding, No abnormal genital discharge  Hem/Onc: No  easy bruising/bleeding, No  abnormal lymph node  Endocrine: No cold intolerance,  No heat intolerance. No polyuria/polydipsia/polyphagia   Neurologic: No  weakness, No  dizziness, No  slurred speech/focal weakness/facial droop  Psychiatric: No  concerns with depression, No  concerns with anxiety, No sleep problems, No mood problems  Exam:  BP 101/62   Pulse 83   Temp 98.1 F (36.7 C) (Oral)   Wt 167 lb (75.8 kg)   BMI 25.02  kg/m   Constitutional: VS see above. General Appearance: alert, well-developed, well-nourished, NAD  Eyes: Normal lids and conjunctive, non-icteric sclera  Ears, Nose, Mouth, Throat: MMM, Normal external inspection ears/nares/mouth/lips/gums. TM normal bilaterally. Pharynx/tonsils no erythema, no exudate. Nasal mucosa normal.   Neck: No masses, trachea midline. No thyroid enlargement. No tenderness/mass appreciated. No lymphadenopathy  Respiratory: Normal respiratory effort. no  wheeze, no rhonchi, no rales  Cardiovascular: S1/S2 normal, no murmur, no rub/gallop auscultated. RRR. No lower extremity edema. Pedal pulse II/IV bilaterally DP and PT. No carotid bruit or JVD. No abdominal aortic bruit.  Gastrointestinal: Nontender, no masses. No hepatomegaly, no splenomegaly. No hernia appreciated. Bowel sounds normal. Rectal exam deferred.   Musculoskeletal: Gait normal. No clubbing/cyanosis of digits.   Neurological: Normal balance/coordination. No tremor. No cranial nerve deficit on limited exam. Motor and sensation intact and symmetric. Cerebellar reflexes intact.   Skin: warm, dry, intact. No rash/ulcer. No concerning nevi or subq nodules on limited exam.  Small superficial rough papule on L scapular area, skin oclored  Psychiatric: Normal judgment/insight. Normal mood and affect. Oriented x3.    No results found for this or any previous visit (from the past 72 hour(s)).  No results found.   ASSESSMENT/PLAN: The primary encounter diagnosis was Annual physical exam. Diagnoses of Hypothyroidism, unspecified type, Hormone replacement therapy (postmenopausal), History of colonoscopy, History of hysterectomy for benign disease, Vitamin D deficiency, Hormone replacement therapy (postmenopausal), and Actinic skin damage were also pertinent to this visit.   Orders Placed This Encounter  Procedures  . CBC  . COMPLETE METABOLIC PANEL WITH GFR  . Lipid panel  . TSH  . Hepatitis C antibody  . HIV Antibody (routine testing w rflx)  . VITAMIN D 25 Hydroxy (Vit-D Deficiency, Fractures)   Meds ordered this encounter  Medications  . estradiol (VIVELLE-DOT) 0.025 MG/24HR    Sig: APPLY 1 PATCH EXTERNALLY TO THE SKIN 2 TIMES A WEEK.    Dispense:  8 patch    Refill:  11  . levothyroxine (SYNTHROID, LEVOTHROID) 75 MCG tablet    Sig: Take 1 tablet (75 mcg total) by mouth daily before breakfast.    Dispense:  90 tablet    Refill:  3         Patient Instructions   General Preventive Care  Most recent routine screening lipids/other labs: ordered today. Cholesterol and Diabetes screening usually recommended annually.   Everyone should have blood pressure checked once per year.   Tobacco: don't! Alcohol: responsible moderation is ok for most adults - if you have concerns about your alcohol intake, please talk to me! Recreational/Illicit Drugs: don't!  Exercise: as tolerated to reduce risk of cardiovascular disease and diabetes. Strength training will also prevent osteoporosis.   Mental health: if need for mental health care (medicines, counseling, other), or concerns about moods, please let me know!   Sexual health: if need for STD testing, or if concerns with libido/pain problems, please let me know!  Vaccines  Flu vaccine: recommended for almost everyone, every fall (by Halloween! Flu is scary!), especially if you are pregnant, if you have exposure to the public, if you're around young children or the elderly, or if you're around pregnant people.   Shingles vaccine: Shingrix recommended after age 75   Pneumonia vaccines: Prevnar and Pneumovax recommended after age 69, sooner if diabetes, COPD/asthma, others  Tetanus booster: Tdap recommended every 10 years Cancer screenings   Colon cancer screening: recommended for everyone at age 22  Breast cancer screening: mammogram  annually after age 90.   Cervical cancer screening: Can stop w/ hysterectomy as long as previous testing was normal.  Infection screenings . HIV: recommended screening at least once age 89-65, or as needed . Gonorrhea/Chlamydia: screening as needed . Hepatitis C: recommended once for anyone born 24-1966 . TB: certain at-risk populations, or depending on work requirements and/or travel history Other . Bone Density Test: recommended for women at age 65, men at age 19, sooner depending on risk factors . Advanced Directive: Living Will and/or Healthcare Power of Attorney  recommended for all adults, regardless of age or health!    Patient declines flu shot, tetanus vaccine.  She thinks she can get the Tdap at work  Cryotherapy applied to what appears to be some actinic damage overlying left scapula.  Patient tolerated procedure well, standard postprocedure care instructions advised to return to clinic if any concerns   There is no immunization history on file for this patient.    Visit summary with medication list and pertinent instructions was printed for patient to review. All questions at time of visit were answered - patient instructed to contact office with any additional concerns. ER/RTC precautions were reviewed with the patient.   Follow-up plan: Return in about 1 year (around 10/26/2018) for annual physical, sooner if needed.   Please note: voice recognition software was used to produce this document, and typos may escape review. Please contact Dr. Sheppard Coil for any needed clarifications.

## 2017-10-31 LAB — COMPLETE METABOLIC PANEL WITH GFR
AG Ratio: 2 (calc) (ref 1.0–2.5)
ALBUMIN MSPROF: 4.7 g/dL (ref 3.6–5.1)
ALKALINE PHOSPHATASE (APISO): 68 U/L (ref 33–130)
ALT: 30 U/L — ABNORMAL HIGH (ref 6–29)
AST: 21 U/L (ref 10–35)
BUN: 14 mg/dL (ref 7–25)
CHLORIDE: 103 mmol/L (ref 98–110)
CO2: 26 mmol/L (ref 20–32)
CREATININE: 0.81 mg/dL (ref 0.50–1.05)
Calcium: 9.6 mg/dL (ref 8.6–10.4)
GFR, EST NON AFRICAN AMERICAN: 81 mL/min/{1.73_m2} (ref >=60)
GFR, Est African American: 93 mL/min/{1.73_m2} (ref >=60)
GLOBULIN: 2.3 g/dL (ref 1.9–3.7)
GLUCOSE: 113 mg/dL — AB (ref 65–99)
Potassium: 4.6 mmol/L (ref 3.5–5.3)
SODIUM: 137 mmol/L (ref 135–146)
Total Bilirubin: 2.1 mg/dL — ABNORMAL HIGH (ref 0.2–1.2)
Total Protein: 7 g/dL (ref 6.1–8.1)

## 2017-10-31 LAB — HEPATITIS C ANTIBODY
Hepatitis C Ab: NONREACTIVE
SIGNAL TO CUT-OFF: 0.01 (ref <1.00)

## 2017-10-31 LAB — CBC
HEMATOCRIT: 38.8 % (ref 35.0–45.0)
HEMOGLOBIN: 13.7 g/dL (ref 11.7–15.5)
MCH: 31.6 pg (ref 27.0–33.0)
MCHC: 35.3 g/dL (ref 32.0–36.0)
MCV: 89.6 fL (ref 80.0–100.0)
MPV: 10.4 fL (ref 7.5–12.5)
Platelets: 288 10*3/uL (ref 140–400)
RBC: 4.33 10*6/uL (ref 3.80–5.10)
RDW: 11.8 % (ref 11.0–15.0)
WBC: 4.7 10*3/uL (ref 3.8–10.8)

## 2017-10-31 LAB — LIPID PANEL
Cholesterol: 200 mg/dL — ABNORMAL HIGH (ref <200)
HDL: 56 mg/dL (ref >50)
LDL Cholesterol (Calc): 124 mg/dL (calc) — ABNORMAL HIGH
Non-HDL Cholesterol (Calc): 144 mg/dL (calc) — ABNORMAL HIGH (ref <130)
Total CHOL/HDL Ratio: 3.6 (calc) (ref <5.0)
Triglycerides: 97 mg/dL (ref <150)

## 2017-10-31 LAB — TEST AUTHORIZATION

## 2017-10-31 LAB — HEMOGLOBIN A1C W/OUT EAG: Hgb A1c MFr Bld: 5.4 % of total Hgb (ref <5.7)

## 2017-10-31 LAB — HIV ANTIBODY (ROUTINE TESTING W REFLEX): HIV 1&2 Ab, 4th Generation: NONREACTIVE

## 2017-10-31 LAB — TSH: TSH: 0.82 mIU/L (ref 0.40–4.50)

## 2017-10-31 LAB — VITAMIN D 25 HYDROXY (VIT D DEFICIENCY, FRACTURES): VIT D 25 HYDROXY: 31 ng/mL (ref 30–100)

## 2017-11-03 ENCOUNTER — Encounter: Payer: Self-pay | Admitting: Osteopathic Medicine

## 2017-12-19 ENCOUNTER — Ambulatory Visit
Admission: RE | Admit: 2017-12-19 | Discharge: 2017-12-19 | Disposition: A | Discharge status: Home / Self Care | Payer: 59 | Source: Ambulatory Visit | Attending: Osteopathic Medicine | Admitting: Osteopathic Medicine

## 2017-12-19 DIAGNOSIS — Z1231 Encounter for screening mammogram for malignant neoplasm of breast: Secondary | ICD-10-CM

## 2018-08-09 ENCOUNTER — Telehealth: Payer: Self-pay | Admitting: Osteopathic Medicine

## 2018-08-09 DIAGNOSIS — Z0000 Encounter for general adult medical examination without abnormal findings: Secondary | ICD-10-CM

## 2018-08-09 DIAGNOSIS — E559 Vitamin D deficiency, unspecified: Secondary | ICD-10-CM

## 2018-08-09 DIAGNOSIS — E039 Hypothyroidism, unspecified: Secondary | ICD-10-CM

## 2018-08-09 DIAGNOSIS — Z79890 Hormone replacement therapy (postmenopausal): Secondary | ICD-10-CM

## 2018-08-09 DIAGNOSIS — Z13220 Encounter for screening for lipoid disorders: Secondary | ICD-10-CM

## 2018-08-09 NOTE — Telephone Encounter (Signed)
Pt called. She needs refill on her Levothyroxide.  Does she need an appointment?

## 2018-08-09 NOTE — Telephone Encounter (Signed)
Okay to refill for 90-day supply, but patient has not been seen since her last annual in October 2019, and we please schedule her for annual checkup and let me know if she is able to get blood work done ahead of time and can put in orders for TSH, CBC, CMP, lipids

## 2018-08-10 MED ORDER — LEVOTHYROXINE SODIUM 75 MCG PO TABS: 75.0000 ug | ORAL_TABLET | Freq: Every day | ORAL | 0 refills | Status: DC

## 2018-08-10 NOTE — Telephone Encounter (Signed)
She wants Vitamin D added to lab order.

## 2018-08-10 NOTE — Telephone Encounter (Signed)
Vitamin D has been an issue - many insurance companies are not covering this test. She should call her insurance company and see if this is part of annual physical

## 2018-08-10 NOTE — Telephone Encounter (Signed)
Advised patient of recommendations. She does have a vitamin d deficiency. I added the Vitamin D with the deficiency diagnosis code.

## 2018-08-10 NOTE — Telephone Encounter (Signed)
Thank you :)

## 2018-08-10 NOTE — Telephone Encounter (Signed)
I called pt.  Pt said she believes she's already done the blood work you are wanting her to do( she had bloodwork done at her job  due to potent stuff she works around)   She is going to fax labs over that she had done. She can do labs ahead time. Pt aware refills are at her pharmacy.

## 2018-08-10 NOTE — Telephone Encounter (Signed)
RX sent and labs pended.   Please call pt and get her scheduled for a physical, she can go get blood work done prior to appt.

## 2018-10-11 ENCOUNTER — Other Ambulatory Visit: Payer: Self-pay | Admitting: Osteopathic Medicine

## 2018-10-11 DIAGNOSIS — Z1231 Encounter for screening mammogram for malignant neoplasm of breast: Secondary | ICD-10-CM

## 2018-10-17 LAB — VITAMIN D 25 HYDROXY (VIT D DEFICIENCY, FRACTURES): Vit D, 25-Hydroxy: 29 ng/mL — ABNORMAL LOW (ref 30–100)

## 2018-10-19 ENCOUNTER — Ambulatory Visit (INDEPENDENT_AMBULATORY_CARE_PROVIDER_SITE_OTHER): Payer: Managed Care, Other (non HMO) | Admitting: Osteopathic Medicine

## 2018-10-19 ENCOUNTER — Other Ambulatory Visit: Payer: Self-pay

## 2018-10-19 ENCOUNTER — Encounter: Payer: Self-pay | Admitting: Osteopathic Medicine

## 2018-10-19 VITALS — BP 126/82 | HR 66 | Temp 97.9°F | Wt 170.7 lb

## 2018-10-19 DIAGNOSIS — Z23 Encounter for immunization: Secondary | ICD-10-CM | POA: Diagnosis not present

## 2018-10-19 DIAGNOSIS — E789 Disorder of lipoprotein metabolism, unspecified: Secondary | ICD-10-CM

## 2018-10-19 DIAGNOSIS — Z0000 Encounter for general adult medical examination without abnormal findings: Secondary | ICD-10-CM | POA: Diagnosis not present

## 2018-10-19 DIAGNOSIS — E559 Vitamin D deficiency, unspecified: Secondary | ICD-10-CM

## 2018-10-19 DIAGNOSIS — E039 Hypothyroidism, unspecified: Secondary | ICD-10-CM | POA: Diagnosis not present

## 2018-10-19 DIAGNOSIS — Z79890 Hormone replacement therapy (postmenopausal): Secondary | ICD-10-CM

## 2018-10-19 LAB — COMPLETE METABOLIC PANEL WITH GFR
AG Ratio: 1.8 (calc) (ref 1.0–2.5)
ALT: 41 U/L — ABNORMAL HIGH (ref 6–29)
AST: 27 U/L (ref 10–35)
Albumin: 4.7 g/dL (ref 3.6–5.1)
Alkaline phosphatase (APISO): 62 U/L (ref 37–153)
BUN: 11 mg/dL (ref 7–25)
CO2: 26 mmol/L (ref 20–32)
Calcium: 9.4 mg/dL (ref 8.6–10.4)
Chloride: 106 mmol/L (ref 98–110)
Creat: 0.72 mg/dL (ref 0.50–1.05)
GFR, Est African American: 107 mL/min/{1.73_m2} (ref >=60)
GFR, Est Non African American: 92 mL/min/{1.73_m2} (ref >=60)
Globulin: 2.6 g/dL (calc) (ref 1.9–3.7)
Glucose, Bld: 101 mg/dL — ABNORMAL HIGH (ref 65–99)
Potassium: 4.4 mmol/L (ref 3.5–5.3)
Sodium: 140 mmol/L (ref 135–146)
Total Bilirubin: 1.6 mg/dL — ABNORMAL HIGH (ref 0.2–1.2)
Total Protein: 7.3 g/dL (ref 6.1–8.1)

## 2018-10-19 LAB — CBC WITH DIFFERENTIAL/PLATELET
Absolute Monocytes: 372 cells/uL (ref 200–950)
Basophils Absolute: 49 cells/uL (ref 0–200)
Basophils Relative: 1 %
Eosinophils Absolute: 59 cells/uL (ref 15–500)
Eosinophils Relative: 1.2 %
HCT: 41 % (ref 35.0–45.0)
Hemoglobin: 14 g/dL (ref 11.7–15.5)
Lymphs Abs: 1411 cells/uL (ref 850–3900)
MCH: 30.9 pg (ref 27.0–33.0)
MCHC: 34.1 g/dL (ref 32.0–36.0)
MCV: 90.5 fL (ref 80.0–100.0)
MPV: 10.2 fL (ref 7.5–12.5)
Monocytes Relative: 7.6 %
Neutro Abs: 3009 cells/uL (ref 1500–7800)
Neutrophils Relative %: 61.4 %
Platelets: 292 10*3/uL (ref 140–400)
RBC: 4.53 10*6/uL (ref 3.80–5.10)
RDW: 11.8 % (ref 11.0–15.0)
Total Lymphocyte: 28.8 %
WBC: 4.9 10*3/uL (ref 3.8–10.8)

## 2018-10-19 LAB — LIPID PANEL W/REFLEX DIRECT LDL
Cholesterol: 213 mg/dL — ABNORMAL HIGH (ref <200)
HDL: 58 mg/dL (ref >=50)
LDL Cholesterol (Calc): 133 mg/dL (calc) — ABNORMAL HIGH
Non-HDL Cholesterol (Calc): 155 mg/dL (calc) — ABNORMAL HIGH (ref <130)
Total CHOL/HDL Ratio: 3.7 (calc) (ref <5.0)
Triglycerides: 117 mg/dL (ref <150)

## 2018-10-19 LAB — TSH: TSH: 0.87 mIU/L (ref 0.40–4.50)

## 2018-10-19 LAB — HEMOGLOBIN A1C W/OUT EAG: Hgb A1c MFr Bld: 5.4 % of total Hgb (ref <5.7)

## 2018-10-19 MED ORDER — ESTRADIOL 0.025 MG/24HR TD PTTW: MEDICATED_PATCH | TRANSDERMAL | 3 refills | Status: DC

## 2018-10-19 MED ORDER — FLUTICASONE PROPIONATE 50 MCG/ACT NA SUSP: 2.0000 | Freq: Every day | NASAL | 3 refills | Status: DC

## 2018-10-19 MED ORDER — LEVOTHYROXINE SODIUM 75 MCG PO TABS: 75.0000 ug | ORAL_TABLET | Freq: Every day | ORAL | 3 refills | Status: DC

## 2018-10-19 MED ORDER — ZOSTER VAC RECOMB ADJUVANTED 50 MCG/0.5ML IM SUSR: 0.5000 mL | Freq: Once | INTRAMUSCULAR | 1 refills | Status: AC

## 2018-10-19 MED ORDER — ROC RETINOL CORREXION NIGHT EX CREA: 1.0000 "application " | TOPICAL_CREAM | Freq: Every day | CUTANEOUS | 3 refills | Status: DC

## 2018-10-19 MED ORDER — VITAMIN D (ERGOCALCIFEROL) 1.25 MG (50000 UNIT) PO CAPS
50000.0000 [IU] | ORAL_CAPSULE | ORAL | 0 refills | Status: DC
Start: 2018-10-19 — End: 2019-12-16

## 2018-10-19 NOTE — Progress Notes (Signed)
HPI: Teresa Sullivan is a 58 y.o. female who  has a past medical history of History of colonoscopy (09/08/2014), History of cyst of breast (09/08/2014), History of hysterectomy for benign disease (09/08/2014), Hormone replacement therapy (postmenopausal) (09/08/2014), Hypothyroidism (09/08/2014), and Thyroid disease.  she presents to [facility] today, 10/19/18,  for chief complaint of: Annual physical  Patient here for annual physical / wellness exam.  See preventive care reviewed as below.  Recent labs reviewed in detail with the patient.   Additional concerns today include:  Would like Rx for retinol cream, her daughter got something from dermatologist that's helping her skin     Past medical, surgical, social and family history reviewed:  Patient Active Problem List   Diagnosis Date Noted  . Hypothyroidism 09/08/2014  . Hormone replacement therapy (postmenopausal) 09/08/2014  . History of cyst of breast 09/08/2014  . History of hysterectomy for benign disease 09/08/2014  . History of colonoscopy 09/08/2014    Past Surgical History:  Procedure Laterality Date  . ABDOMINAL HYSTERECTOMY  04-08-09   total hysterectomy Dr Neale Burly  . BREAST EXCISIONAL BIOPSY Left 1999   benign  . BREAST SURGERY      Social History   Tobacco Use  . Smoking status: Never Smoker  . Smokeless tobacco: Never Used  Substance Use Topics  . Alcohol use: Yes    Comment: 1 x week    Family History  Problem Relation Age of Onset  . Stroke Mother   . Aneurysm Mother   . Diabetes Father   . Heart disease Father   . Diabetes Paternal Aunt      Current medication list and allergy/intolerance information reviewed:    Current Outpatient Medications  Medication Sig Dispense Refill  . estradiol (VIVELLE-DOT) 0.025 MG/24HR APPLY 1 PATCH EXTERNALLY TO THE SKIN 2 TIMES A WEEK. 24 patch 3  . fluticasone (FLONASE) 50 MCG/ACT nasal spray Place 2 sprays into both  nostrils daily. 48 g 3  . levothyroxine (SYNTHROID) 75 MCG tablet Take 1 tablet (75 mcg total) by mouth daily before breakfast. 90 tablet 3  . Emollient (ROC RETINOL CORREXION NIGHT) CREA Apply 1 application topically at bedtime. 30 mL 3  . Vitamin D, Ergocalciferol, (DRISDOL) 1.25 MG (50000 UT) CAPS capsule Take 1 capsule (50,000 Units total) by mouth every 7 (seven) days. Take for 12 total doses(weeks) than can transition to 2000 units OTC supplement daily 12 capsule 0   No current facility-administered medications for this visit.     No Known Allergies    Review of Systems:  Constitutional:  No  fever, no chills, No recent illness, No unintentional weight changes. No significant fatigue.   HEENT: No  headache, no vision change, no hearing change, No sore throat, No  sinus pressure  Cardiac: No  chest pain, No  pressure  Respiratory:  No  shortness of breath. No  Cough  Gastrointestinal: No  abdominal pain, No  nausea, No  vomiting,  No  blood in stool, No  diarrhea, No  constipation   Musculoskeletal: No new myalgia/arthralgia  Skin: No  Rash  Genitourinary: No  incontinence, No  abnormal genital bleeding, No abnormal genital discharge  Hem/Onc: No  easy bruising/bleeding, No  abnormal lymph node  Endocrine: No cold intolerance,  No heat intolerance. No polyuria/polydipsia/polyphagia   Neurologic: No  weakness, No  dizziness, No  slurred speech/focal weakness/facial droop  Psychiatric: No  concerns with depression, No  concerns with anxiety, No sleep  problems, No mood problems  Exam:  BP 126/82 (BP Location: Left Arm, Patient Position: Sitting, Cuff Size: Normal)   Pulse 66   Temp 97.9 F (36.6 C) (Oral)   Wt 170 lb 11.2 oz (77.4 kg)   BMI 25.58 kg/m   Constitutional: VS see above. General Appearance: alert, well-developed, well-nourished, NAD  Neck: No masses, trachea midline. No thyroid enlargement. No tenderness/mass appreciated. No  lymphadenopathy  Respiratory: Normal respiratory effort. no wheeze, no rhonchi, no rales  Cardiovascular: S1/S2 normal, no murmur, no rub/gallop auscultated. RRR.  Gastrointestinal: Nontender, no masses. No hepatomegaly, no splenomegaly. No hernia appreciated. Bowel sounds normal. Rectal exam deferred.   Musculoskeletal: Gait normal. No clubbing/cyanosis of digits.   Neurological: Normal balance/coordination. No tremor. No cranial nerve deficit on limited exam. Motor and sensation intact and symmetric. Cerebellar reflexes intact.   Skin: warm, dry, intact. No rash/ulcer. No concerning nevi or subq nodules on limited exam.  Small superficial rough papule on L scapular area, skin oclored  Psychiatric: Normal judgment/insight. Normal mood and affect. Oriented x3.    No results found for this or any previous visit (from the past 72 hour(s)).  No results found.   ASSESSMENT/PLAN: The primary encounter diagnosis was Annual physical exam. Diagnoses of Hormone replacement therapy (postmenopausal), Hypothyroidism, unspecified type, Need for varicella vaccine, Vitamin D deficiency, and Borderline high cholesterol were also pertinent to this visit.   The 10-year ASCVD risk score Mikey Bussing DC Brooke Bonito., et al., 2013) is: 2.6%   Values used to calculate the score:     Age: 58 years     Sex: Female     Is Non-Hispanic African American: No     Diabetic: No     Tobacco smoker: No     Systolic Blood Pressure: 123XX123 mmHg     Is BP treated: No     HDL Cholesterol: 58 mg/dL     Total Cholesterol: 213 mg/dL   No orders of the defined types were placed in this encounter.  Meds ordered this encounter  Medications  . Emollient (ROC RETINOL CORREXION NIGHT) CREA    Sig: Apply 1 application topically at bedtime.    Dispense:  30 mL    Refill:  3  . estradiol (VIVELLE-DOT) 0.025 MG/24HR    Sig: APPLY 1 PATCH EXTERNALLY TO THE SKIN 2 TIMES A WEEK.    Dispense:  24 patch    Refill:  3  . fluticasone  (FLONASE) 50 MCG/ACT nasal spray    Sig: Place 2 sprays into both nostrils daily.    Dispense:  48 g    Refill:  3  . levothyroxine (SYNTHROID) 75 MCG tablet    Sig: Take 1 tablet (75 mcg total) by mouth daily before breakfast.    Dispense:  90 tablet    Refill:  3  . Vitamin D, Ergocalciferol, (DRISDOL) 1.25 MG (50000 UT) CAPS capsule    Sig: Take 1 capsule (50,000 Units total) by mouth every 7 (seven) days. Take for 12 total doses(weeks) than can transition to 2000 units OTC supplement daily    Dispense:  12 capsule    Refill:  0         Patient Instructions  General Preventive Care  Most recent routine screening lipids/other labs: already done!   Everyone should have blood pressure checked once per year.   Tobacco: don't!   Alcohol: responsible moderation is ok for most adults - if you have concerns about your alcohol intake, please talk to me!  Exercise: as tolerated to reduce risk of cardiovascular disease and diabetes. Strength training will also prevent osteoporosis.   Mental health: if need for mental health care (medicines, counseling, other), or concerns about moods, please let me know!   Sexual health: if need for STD testing, or if concerns with libido/pain problems, please let me know!   Advanced Directive: Living Will and/or Healthcare Power of Attorney recommended for all adults, regardless of age or health.  Vaccines  Flu vaccine: recommended for almost everyone, every fall.   Shingles vaccine: Shingrix recommended after age 41.  Pneumonia vaccines: Prevnar and Pneumovax recommended after age 44, or sooner if certain medical conditions.  Tetanus booster: Tdap recommended every 10 years.  Cancer screenings   Colon cancer screening: due next spring!   Breast cancer screening: mammogram recommended and annually after age 53.   Cervical cancer screening:  Can usually stop Pap at age 34 or w/ hysterectomy.   Lung cancer screening: not needed for  non-smokers . Infection screenings . HIV: done! Screen as needed . Gonorrhea/Chlamydia: screening as needed . Hepatitis C: recommended for anyone born 24-1966. Done! Screen as needed.  . TB: certain at-risk populations, or depending on work requirements and/or travel history Other . Bone Density Test: recommended for women at age 34    Patient declines flu shot, tetanus vaccine.  She thinks she can get the Tdap at work. Interestd in shingles shot but we are out of stock!     There is no immunization history for the selected administration types on file for this patient.    Visit summary with medication list and pertinent instructions was printed for patient to review. All questions at time of visit were answered - patient instructed to contact office with any additional concerns. ER/RTC precautions were reviewed with the patient.   Follow-up plan: Return in about 1 year (around 10/19/2019) for [place] (call week prior to visit for lab orders).   Please note: voice recognition software was used to produce this document, and typos may escape review. Please contact Dr. Sheppard Coil for any needed clarifications.

## 2018-10-19 NOTE — Patient Instructions (Signed)
General Preventive Care  Most recent routine screening lipids/other labs: already done!   Everyone should have blood pressure checked once per year.   Tobacco: don't!   Alcohol: responsible moderation is ok for most adults - if you have concerns about your alcohol intake, please talk to me!   Exercise: as tolerated to reduce risk of cardiovascular disease and diabetes. Strength training will also prevent osteoporosis.   Mental health: if need for mental health care (medicines, counseling, other), or concerns about moods, please let me know!   Sexual health: if need for STD testing, or if concerns with libido/pain problems, please let me know!   Advanced Directive: Living Will and/or Healthcare Power of Attorney recommended for all adults, regardless of age or health.  Vaccines  Flu vaccine: recommended for almost everyone, every fall.   Shingles vaccine: Shingrix recommended after age 74.  Pneumonia vaccines: Prevnar and Pneumovax recommended after age 67, or sooner if certain medical conditions.  Tetanus booster: Tdap recommended every 10 years.  Cancer screenings   Colon cancer screening: due next spring!   Breast cancer screening: mammogram recommended and annually after age 22.   Cervical cancer screening:  Can usually stop Pap at age 58 or w/ hysterectomy.   Lung cancer screening: not needed for non-smokers . Infection screenings . HIV: done! Screen as needed . Gonorrhea/Chlamydia: screening as needed . Hepatitis C: recommended for anyone born 28-1966. Done! Screen as needed.  . TB: certain at-risk populations, or depending on work requirements and/or travel history Other . Bone Density Test: recommended for women at age 4

## 2018-10-31 ENCOUNTER — Other Ambulatory Visit: Payer: Self-pay | Admitting: Osteopathic Medicine

## 2018-10-31 DIAGNOSIS — E039 Hypothyroidism, unspecified: Secondary | ICD-10-CM

## 2018-11-09 ENCOUNTER — Telehealth: Payer: Self-pay

## 2018-11-09 NOTE — Telephone Encounter (Signed)
Pt called stating if she could get rx hard copy for emollient. As per pt, cream is expensive and pt would like to shop around for best pricing. Pls advise, thanks.

## 2018-11-10 ENCOUNTER — Other Ambulatory Visit: Payer: Self-pay | Admitting: Osteopathic Medicine

## 2018-11-10 DIAGNOSIS — Z79890 Hormone replacement therapy (postmenopausal): Secondary | ICD-10-CM

## 2018-11-12 MED ORDER — ROC RETINOL CORREXION NIGHT EX CREA: 1.0000 "application " | TOPICAL_CREAM | Freq: Every day | CUTANEOUS | 3 refills | Status: DC

## 2018-11-12 NOTE — Telephone Encounter (Signed)
Pt has been updated and is aware rx available for p/u at front desk. No other inquiries during call.

## 2018-11-12 NOTE — Telephone Encounter (Signed)
Printed, can take up front and leave there for patient to pick up

## 2018-11-16 ENCOUNTER — Other Ambulatory Visit: Payer: Self-pay

## 2018-11-16 ENCOUNTER — Ambulatory Visit (INDEPENDENT_AMBULATORY_CARE_PROVIDER_SITE_OTHER): Payer: Managed Care, Other (non HMO) | Admitting: Osteopathic Medicine

## 2018-11-16 VITALS — BP 117/73 | HR 72 | Temp 98.4°F | Ht 68.0 in | Wt 170.0 lb

## 2018-11-16 DIAGNOSIS — Z23 Encounter for immunization: Secondary | ICD-10-CM | POA: Diagnosis not present

## 2018-11-16 NOTE — Progress Notes (Signed)
Patient was in office to get 1st shingles vaccine. She tolerated the injection well in right deltoid with no immediate complications. She was given the VIS and did not have any questions about potential side effects. Patient will schedule her second vaccine 2 months out for the final one.

## 2018-12-24 ENCOUNTER — Other Ambulatory Visit: Payer: Self-pay

## 2018-12-24 ENCOUNTER — Ambulatory Visit
Admission: RE | Admit: 2018-12-24 | Discharge: 2018-12-24 | Disposition: A | Discharge status: Home / Self Care | Payer: Managed Care, Other (non HMO) | Source: Ambulatory Visit | Attending: Osteopathic Medicine | Admitting: Osteopathic Medicine

## 2018-12-24 DIAGNOSIS — Z1231 Encounter for screening mammogram for malignant neoplasm of breast: Secondary | ICD-10-CM

## 2018-12-25 ENCOUNTER — Other Ambulatory Visit: Payer: Self-pay | Admitting: Osteopathic Medicine

## 2018-12-25 DIAGNOSIS — R928 Other abnormal and inconclusive findings on diagnostic imaging of breast: Secondary | ICD-10-CM

## 2018-12-28 ENCOUNTER — Ambulatory Visit
Admission: RE | Admit: 2018-12-28 | Discharge: 2018-12-28 | Disposition: A | Discharge status: Home / Self Care | Payer: Managed Care, Other (non HMO) | Source: Ambulatory Visit | Attending: Osteopathic Medicine | Admitting: Osteopathic Medicine

## 2018-12-28 ENCOUNTER — Other Ambulatory Visit: Payer: Self-pay

## 2018-12-28 DIAGNOSIS — R928 Other abnormal and inconclusive findings on diagnostic imaging of breast: Secondary | ICD-10-CM

## 2019-01-01 ENCOUNTER — Other Ambulatory Visit: Payer: Managed Care, Other (non HMO)

## 2019-01-18 ENCOUNTER — Ambulatory Visit (INDEPENDENT_AMBULATORY_CARE_PROVIDER_SITE_OTHER): Payer: Managed Care, Other (non HMO) | Admitting: Physician Assistant

## 2019-01-18 ENCOUNTER — Other Ambulatory Visit: Payer: Self-pay

## 2019-01-18 VITALS — Temp 98.3°F | Ht 68.0 in | Wt 170.0 lb

## 2019-01-18 DIAGNOSIS — Z23 Encounter for immunization: Secondary | ICD-10-CM | POA: Diagnosis not present

## 2019-01-18 NOTE — Progress Notes (Signed)
Patient was in office to get 2nd shingles vaccine. She tolerated the injection well in right deltoid with no immediate complications.  Patient reports that she was having a slight fever and some arm pain with the first injection. She reports it went away after a day or so. She was advised if the fever lasting longer than a day or so since it was over the weekend to go to the urgent care or the ER to be evaluated and she did not have any questions.

## 2019-03-19 ENCOUNTER — Encounter: Payer: Self-pay | Admitting: Sports Medicine

## 2019-03-19 ENCOUNTER — Other Ambulatory Visit: Payer: Self-pay

## 2019-03-19 ENCOUNTER — Ambulatory Visit (INDEPENDENT_AMBULATORY_CARE_PROVIDER_SITE_OTHER): Payer: Managed Care, Other (non HMO)

## 2019-03-19 ENCOUNTER — Ambulatory Visit (INDEPENDENT_AMBULATORY_CARE_PROVIDER_SITE_OTHER): Payer: Managed Care, Other (non HMO) | Admitting: Sports Medicine

## 2019-03-19 DIAGNOSIS — M25512 Pain in left shoulder: Secondary | ICD-10-CM

## 2019-03-19 MED ORDER — MELOXICAM 15 MG PO TABS: ORAL_TABLET | ORAL | 3 refills | Status: DC

## 2019-03-19 NOTE — Assessment & Plan Note (Signed)
Teresa Sullivan has had left anterior shoulder pain now for some time. Clinically it resembles a bicipital tendinitis, she has a positive speeds and Yergason tests. Rotator cuff signs are really not there today with the exception of mildly weak supraspinatus. As her symptoms are waking her from sleep today I performed an ultrasound-guided biceps sheath injection, adding meloxicam, home rehab exercises, x-rays. Return to see me in 1 month.  MRI if no better.

## 2019-03-19 NOTE — Progress Notes (Signed)
    Procedures performed today:    Procedure: Real-time Ultrasound Guided injection of the left biceps tendon sheath Device: Samsung HS60  Verbal informed consent obtained.  Time-out conducted.  Noted no overlying erythema, induration, or other signs of local infection.  Skin prepped in a sterile fashion.  Local anesthesia: Topical Ethyl chloride.  With sterile technique and under real time ultrasound guidance: 1 cc Kenalog 40, 1 cc lidocaine, 1 cc bupivacaine injected easily Completed without difficulty  Pain immediately resolved suggesting accurate placement of the medication.  Advised to call if fevers/chills, erythema, induration, drainage, or persistent bleeding.  Images permanently stored and available for review in the ultrasound unit.  Impression: Technically successful ultrasound guided injection.  Independent interpretation of notes and tests performed by another provider:   None.  Impression and Recommendations:    Left anterior shoulder pain Teresa Sullivan has had left anterior shoulder pain now for some time. Clinically it resembles a bicipital tendinitis, she has a positive speeds and Yergason tests. Rotator cuff signs are really not there today with the exception of mildly weak supraspinatus. As her symptoms are waking her from sleep today I performed an ultrasound-guided biceps sheath injection, adding meloxicam, home rehab exercises, x-rays. Return to see me in 1 month.  MRI if no better.    ___________________________________________ Gwen Her. Dianah Field, M.D., ABFM., CAQSM. Primary Care and [place] Instructor of [place] of [facility] of Medicine

## 2019-04-01 ENCOUNTER — Ambulatory Visit: Payer: Managed Care, Other (non HMO)

## 2019-04-16 ENCOUNTER — Other Ambulatory Visit: Payer: Self-pay

## 2019-04-16 ENCOUNTER — Encounter: Payer: Self-pay | Admitting: Sports Medicine

## 2019-04-16 ENCOUNTER — Ambulatory Visit (INDEPENDENT_AMBULATORY_CARE_PROVIDER_SITE_OTHER): Payer: Managed Care, Other (non HMO) | Admitting: Sports Medicine

## 2019-04-16 DIAGNOSIS — M25512 Pain in left shoulder: Secondary | ICD-10-CM

## 2019-04-16 NOTE — Assessment & Plan Note (Signed)
Teresa Sullivan returns, she is a pleasant 59 year old female, she had bicipital symptoms at the last visit we did a bicep sheath injection, she returns today feeling significantly better, she still has some residual pain in her shoulder but this time over the deltoid and with positive impingement symptoms and signs. Her biceps tendinitis is resolved, I am going to add some rotator cuff rehab exercises, she has not really been as diligent as she could be. She will get more diligent and do this over the next 4 to 6 weeks, return to see me afterwards and we will consider an MRI versus referral into formal PT if no better.

## 2019-04-16 NOTE — Progress Notes (Signed)
    Procedures performed today:    None.  Independent interpretation of notes and tests performed by another provider:   Shoulder x-rays from the last visit personally reviewed and are overall unremarkable.  Brief History, Exam, Impression, and Recommendations:    Left anterior shoulder pain Teresa Sullivan returns, she is a pleasant 59 year old female, she had bicipital symptoms at the last visit we did a bicep sheath injection, she returns today feeling significantly better, she still has some residual pain in her shoulder but this time over the deltoid and with positive impingement symptoms and signs. Her biceps tendinitis is resolved, I am going to add some rotator cuff rehab exercises, she has not really been as diligent as she could be. She will get more diligent and do this over the next 4 to 6 weeks, return to see me afterwards and we will consider an MRI versus referral into formal PT if no better.    ___________________________________________ Gwen Her. Dianah Field, M.D., ABFM., CAQSM. Primary Care and [place] Instructor of [place] of [facility] of Medicine

## 2019-04-22 ENCOUNTER — Ambulatory Visit: Payer: Managed Care, Other (non HMO) | Attending: Internal Medicine

## 2019-04-22 DIAGNOSIS — Z23 Encounter for immunization: Secondary | ICD-10-CM

## 2019-04-22 NOTE — Progress Notes (Signed)
   Covid-19 Vaccination Clinic  Name:  Teresa Sullivan    MRN: [id number] DOB: [birth year]  04/22/2019  [name] was observed post Covid-19 immunization for 15 minutes without incident. She was provided with Vaccine Information Sheet and instruction to access the V-Safe system.   [name] was instructed to call 911 with any severe reactions post vaccine: Marland Kitchen Difficulty breathing  . Swelling of face and throat  . A fast heartbeat  . A bad rash all over body  . Dizziness and weakness   Immunizations Administered    Name Date Dose VIS Date Route   Pfizer COVID-19 Vaccine 04/22/2019 12:25 PM 0.3 mL 12/21/2018 Intramuscular   Manufacturer: [place]   Lot: [id number]   [place]: [id number]

## 2019-05-14 ENCOUNTER — Ambulatory Visit: Payer: Managed Care, Other (non HMO) | Attending: Internal Medicine

## 2019-05-14 DIAGNOSIS — Z23 Encounter for immunization: Secondary | ICD-10-CM

## 2019-05-14 NOTE — Progress Notes (Signed)
   Covid-19 Vaccination Clinic  Name:  Teresa Sullivan    MRN: [id number] DOB: 1960-05-25  05/14/2019  [name] was observed post Covid-19 immunization for 15 minutes without incident. She was provided with Vaccine Information Sheet and instruction to access the V-Safe system.   [name] was instructed to call 911 with any severe reactions post vaccine: Marland Kitchen Difficulty breathing  . Swelling of face and throat  . A fast heartbeat  . A bad rash all over body  . Dizziness and weakness   Immunizations Administered    Name Date Dose VIS Date Route   Pfizer COVID-19 Vaccine 05/14/2019 12:18 PM 0.3 mL 03/06/2018 Intramuscular   Manufacturer: [place]   Lot: [id number]   [place]: [id number]

## 2019-05-28 ENCOUNTER — Ambulatory Visit: Payer: Managed Care, Other (non HMO) | Admitting: Sports Medicine

## 2019-09-03 ENCOUNTER — Other Ambulatory Visit: Payer: Self-pay | Admitting: Osteopathic Medicine

## 2019-09-03 DIAGNOSIS — Z1231 Encounter for screening mammogram for malignant neoplasm of breast: Secondary | ICD-10-CM

## 2019-10-31 ENCOUNTER — Other Ambulatory Visit: Payer: Self-pay | Admitting: Osteopathic Medicine

## 2019-10-31 DIAGNOSIS — E039 Hypothyroidism, unspecified: Secondary | ICD-10-CM

## 2019-11-01 ENCOUNTER — Other Ambulatory Visit: Payer: Self-pay

## 2019-11-01 DIAGNOSIS — Z0000 Encounter for general adult medical examination without abnormal findings: Secondary | ICD-10-CM

## 2019-11-01 DIAGNOSIS — E039 Hypothyroidism, unspecified: Secondary | ICD-10-CM

## 2019-11-01 NOTE — Progress Notes (Signed)
Labs for annual exam ordered. Pt has been notified.

## 2019-11-05 ENCOUNTER — Other Ambulatory Visit: Payer: Self-pay

## 2019-11-05 DIAGNOSIS — R7989 Other specified abnormal findings of blood chemistry: Secondary | ICD-10-CM

## 2019-11-06 LAB — VITAMIN D 25 HYDROXY (VIT D DEFICIENCY, FRACTURES): Vit D, 25-Hydroxy: 29 ng/mL — ABNORMAL LOW (ref 30–100)

## 2019-11-08 LAB — LIPID PANEL W/REFLEX DIRECT LDL
Cholesterol: 198 mg/dL (ref <200)
HDL: 59 mg/dL (ref >=50)
LDL Cholesterol (Calc): 118 mg/dL (calc) — ABNORMAL HIGH
Non-HDL Cholesterol (Calc): 139 mg/dL (calc) — ABNORMAL HIGH (ref <130)
Total CHOL/HDL Ratio: 3.4 (calc) (ref <5.0)
Triglycerides: 106 mg/dL (ref <150)

## 2019-11-08 LAB — CBC WITH DIFFERENTIAL/PLATELET
Absolute Monocytes: 425 cells/uL (ref 200–950)
Basophils Absolute: 30 cells/uL (ref 0–200)
Basophils Relative: 0.6 %
Eosinophils Absolute: 80 cells/uL (ref 15–500)
Eosinophils Relative: 1.6 %
HCT: 40.3 % (ref 35.0–45.0)
Hemoglobin: 13.6 g/dL (ref 11.7–15.5)
Lymphs Abs: 1245 cells/uL (ref 850–3900)
MCH: 31 pg (ref 27.0–33.0)
MCHC: 33.7 g/dL (ref 32.0–36.0)
MCV: 91.8 fL (ref 80.0–100.0)
MPV: 10.2 fL (ref 7.5–12.5)
Monocytes Relative: 8.5 %
Neutro Abs: 3220 cells/uL (ref 1500–7800)
Neutrophils Relative %: 64.4 %
Platelets: 301 10*3/uL (ref 140–400)
RBC: 4.39 10*6/uL (ref 3.80–5.10)
RDW: 11.3 % (ref 11.0–15.0)
Total Lymphocyte: 24.9 %
WBC: 5 10*3/uL (ref 3.8–10.8)

## 2019-11-08 LAB — COMPLETE METABOLIC PANEL WITH GFR
AG Ratio: 1.7 (calc) (ref 1.0–2.5)
ALT: 31 U/L — ABNORMAL HIGH (ref 6–29)
AST: 22 U/L (ref 10–35)
Albumin: 4.5 g/dL (ref 3.6–5.1)
Alkaline phosphatase (APISO): 62 U/L (ref 37–153)
BUN: 13 mg/dL (ref 7–25)
CO2: 26 mmol/L (ref 20–32)
Calcium: 9.6 mg/dL (ref 8.6–10.4)
Chloride: 104 mmol/L (ref 98–110)
Creat: 0.77 mg/dL (ref 0.50–1.05)
GFR, Est African American: 98 mL/min/{1.73_m2} (ref >=60)
GFR, Est Non African American: 85 mL/min/{1.73_m2} (ref >=60)
Globulin: 2.6 g/dL (calc) (ref 1.9–3.7)
Glucose, Bld: 105 mg/dL — ABNORMAL HIGH (ref 65–99)
Potassium: 4.6 mmol/L (ref 3.5–5.3)
Sodium: 138 mmol/L (ref 135–146)
Total Bilirubin: 1.6 mg/dL — ABNORMAL HIGH (ref 0.2–1.2)
Total Protein: 7.1 g/dL (ref 6.1–8.1)

## 2019-11-08 LAB — HEMOGLOBIN A1C W/OUT EAG: Hgb A1c MFr Bld: 5.6 % of total Hgb (ref <5.7)

## 2019-11-08 LAB — TSH: TSH: 1.15 mIU/L (ref 0.40–4.50)

## 2019-11-12 ENCOUNTER — Encounter: Payer: Self-pay | Admitting: Osteopathic Medicine

## 2019-11-12 ENCOUNTER — Ambulatory Visit (INDEPENDENT_AMBULATORY_CARE_PROVIDER_SITE_OTHER): Payer: Managed Care, Other (non HMO) | Admitting: Osteopathic Medicine

## 2019-11-12 VITALS — BP 127/73 | HR 74 | Temp 98.4°F | Wt 171.0 lb

## 2019-11-12 DIAGNOSIS — Z1211 Encounter for screening for malignant neoplasm of colon: Secondary | ICD-10-CM | POA: Diagnosis not present

## 2019-11-12 DIAGNOSIS — Z79890 Hormone replacement therapy (postmenopausal): Secondary | ICD-10-CM

## 2019-11-12 DIAGNOSIS — E039 Hypothyroidism, unspecified: Secondary | ICD-10-CM

## 2019-11-12 DIAGNOSIS — Z0000 Encounter for general adult medical examination without abnormal findings: Secondary | ICD-10-CM | POA: Diagnosis not present

## 2019-11-12 MED ORDER — LEVOTHYROXINE SODIUM 75 MCG PO TABS: 75.0000 ug | ORAL_TABLET | Freq: Every day | ORAL | 3 refills | Status: DC

## 2019-11-12 MED ORDER — ROC RETINOL CORREXION NIGHT EX CREA: 1.0000 "application " | TOPICAL_CREAM | Freq: Every day | CUTANEOUS | 3 refills | Status: AC

## 2019-11-12 MED ORDER — FLUTICASONE PROPIONATE 50 MCG/ACT NA SUSP
2.0000 | Freq: Every day | NASAL | 3 refills | Status: DC
Start: 2019-11-12 — End: 2020-11-04

## 2019-11-12 MED ORDER — ESTRADIOL 0.025 MG/24HR TD PTTW: MEDICATED_PATCH | TRANSDERMAL | 3 refills | Status: DC

## 2019-11-12 NOTE — Patient Instructions (Signed)
General Preventive Care  Most recent routine screening labs: see attached.   Blood pressure goal 130/80 or less.   Tobacco: don't!  Alcohol: responsible moderation is ok for most adults - if you have concerns about your alcohol intake, please talk to me!   Exercise: as tolerated to reduce risk of cardiovascular disease and diabetes. Strength training will also prevent osteoporosis.   Mental health: if need for mental health care (medicines, counseling, other), or concerns about moods, please let me know!   Sexual / Reproductive health: if need for STD testing, or if concerns with libido/pain problems, please let me know!   Advanced Directive: Living Will and/or Healthcare Power of Attorney recommended for all adults, regardless of age or health.  Vaccines  Flu vaccine: for almost everyone, every fall.   Shingles vaccine: done!  Pneumonia vaccines: after age 72  COVID vaccine: THANKS for getting your vaccine! :)  Cancer screenings   Colon cancer screening: for everyone age 54-75. Colonoscopy available for all, many people also qualify for the Cologuard stool test   Breast cancer screening: mammogram annually age 63-75.   Cervical cancer screening: Pap not needed  w/ hysterectomy.   Lung cancer screening: not needed for non-smokers  Infection screenings   HIV: done, re-screen as needed.  Gonorrhea/Chlamydia: screening as needed  Hepatitis C: recommended once for everyone age 16-10, done  TB: certain at-risk populations, or depending on work requirements and/or travel history Other  Bone Density Test: recommended for women at age 35

## 2019-11-12 NOTE — Progress Notes (Signed)
HPI: Teresa Sullivan is a 59 y.o. female who  has a past medical history of History of colonoscopy (09/08/2014), History of cyst of breast (09/08/2014), History of hysterectomy for benign disease (09/08/2014), Hormone replacement therapy (postmenopausal) (09/08/2014), Hypothyroidism (09/08/2014), and Thyroid disease.  she presents to [facility] today, 11/12/19,  for chief complaint of:  Annual physical       ASSESSMENT/PLAN: The primary encounter diagnosis was Annual physical exam. Diagnoses of Hormone replacement therapy (postmenopausal), Hypothyroidism, unspecified type, and Colon cancer screening were also pertinent to this visit.   Will refer for colonoscopy, pt thinks she is coming due  Flu vax recommended   COVID booster per CDC guidelines, no urgent need for this now   Orders Placed This Encounter  Procedures  . Ambulatory referral to Gastroenterology     Meds ordered this encounter  Medications  . estradiol (VIVELLE-DOT) 0.025 MG/24HR    Sig: APPLY 1 PATCH EXTERNALLY TO THE SKIN 2 TIMES A WEEK    Dispense:  24 patch    Refill:  3  . fluticasone (FLONASE) 50 MCG/ACT nasal spray    Sig: Place 2 sprays into both nostrils daily.    Dispense:  48 g    Refill:  3  . levothyroxine (SYNTHROID) 75 MCG tablet    Sig: Take 1 tablet (75 mcg total) by mouth daily before breakfast.    Dispense:  90 tablet    Refill:  3  . Emollient (ROC RETINOL CORREXION NIGHT) CREA    Sig: Apply 1 application topically at bedtime.    Dispense:  60 mL    Refill:  3    Patient Instructions  General Preventive Care  Most recent routine screening labs: see attached.   Blood pressure goal 130/80 or less.   Tobacco: don't!  Alcohol: responsible moderation is ok for most adults - if you have concerns about your alcohol intake, please talk to me!   Exercise: as tolerated to reduce risk of cardiovascular disease and diabetes. Strength training will also  prevent osteoporosis.   Mental health: if need for mental health care (medicines, counseling, other), or concerns about moods, please let me know!   Sexual / Reproductive health: if need for STD testing, or if concerns with libido/pain problems, please let me know!   Advanced Directive: Living Will and/or Healthcare Power of Attorney recommended for all adults, regardless of age or health.  Vaccines  Flu vaccine: for almost everyone, every fall.   Shingles vaccine: done!  Pneumonia vaccines: after age 26  COVID vaccine: THANKS for getting your vaccine! :)  Cancer screenings   Colon cancer screening: for everyone age 51-75. Colonoscopy available for all, many people also qualify for the Cologuard stool test   Breast cancer screening: mammogram annually age 2-75.   Cervical cancer screening: Pap not needed  w/ hysterectomy.   Lung cancer screening: not needed for non-smokers  Infection screenings  . HIV: done, re-screen as needed. . Gonorrhea/Chlamydia: screening as needed . Hepatitis C: recommended once for everyone age 9-75, done . TB: certain at-risk populations, or depending on work requirements and/or travel history Other . Bone Density Test: recommended for women at age 45      Follow-up plan: Return in about 1 year (around 11/11/2020) for [place] (call week prior to visit for lab orders).                                                 ################################################# ################################################# ################################################# #################################################  Current Meds  Medication Sig  . Emollient (ROC RETINOL CORREXION NIGHT) CREA Apply 1 application topically at bedtime.  Marland Kitchen estradiol (VIVELLE-DOT) 0.025 MG/24HR APPLY 1 PATCH EXTERNALLY TO THE SKIN 2 TIMES A WEEK  . fluticasone (FLONASE) 50 MCG/ACT nasal spray Place 2 sprays into both  nostrils daily.  Marland Kitchen levothyroxine (SYNTHROID) 75 MCG tablet Take 1 tablet (75 mcg total) by mouth daily before breakfast.  . meloxicam (MOBIC) 15 MG tablet One tab PO qAM with a meal for 2 weeks, then daily prn pain.  . Vitamin D, Ergocalciferol, (DRISDOL) 1.25 MG (50000 UT) CAPS capsule Take 1 capsule (50,000 Units total) by mouth every 7 (seven) days. Take for 12 total doses(weeks) than can transition to 2000 units OTC supplement daily  . [DISCONTINUED] Emollient (ROC RETINOL CORREXION NIGHT) CREA Apply 1 application topically at bedtime.  . [DISCONTINUED] estradiol (VIVELLE-DOT) 0.025 MG/24HR APPLY 1 PATCH EXTERNALLY TO THE SKIN 2 TIMES A WEEK  . [DISCONTINUED] fluticasone (FLONASE) 50 MCG/ACT nasal spray Place 2 sprays into both nostrils daily.  . [DISCONTINUED] levothyroxine (SYNTHROID) 75 MCG tablet TAKE 1 TABLET DAILY BEFORE BREAKFAST    No Known Allergies     Review of Systems: Pertinent (+) and (-) ROS in HPI as above   Exam:  BP 127/73 (BP Location: Right Arm, Patient Position: Sitting, Cuff Size: Normal)   Pulse 74   Temp 98.4 F (36.9 C) (Oral)   Wt 171 lb 0.6 oz (77.6 kg)   BMI 26.01 kg/m   Constitutional: VS see above. General Appearance: alert, well-developed, well-nourished, NAD  Neck: No masses, trachea midline.   Respiratory: Normal respiratory effort. no wheeze, no rhonchi, no rales  Cardiovascular: S1/S2 normal, no murmur, no rub/gallop auscultated. RRR.   Musculoskeletal: Gait normal. Symmetric and independent movement of all extremities  Abdominal: non-tender, non-distended, no appreciable organomegaly, neg Murphy's, BS WNLx4  Neurological: Normal balance/coordination. No tremor.  Skin: warm, dry, intact.   Psychiatric: Normal judgment/insight. Normal mood and affect. Oriented x3.       Visit summary with medication list and pertinent instructions was printed for patient to review, patient was advised to alert [place] if any updates are needed. All  questions at time of visit were answered - patient instructed to contact office with any additional concerns. ER/RTC precautions were reviewed with the patient and understanding verbalized.         Please note: voice recognition software was used to produce this document, and typos may escape review. Please contact Dr. Sheppard Coil for any needed clarifications.    Follow up plan: Return in about 1 year (around 11/11/2020) for [place] (call week prior to visit for lab orders).

## 2019-12-16 ENCOUNTER — Telehealth: Payer: Self-pay

## 2019-12-16 MED ORDER — VITAMIN D (ERGOCALCIFEROL) 1.25 MG (50000 UNIT) PO CAPS
50000.0000 [IU] | ORAL_CAPSULE | ORAL | 0 refills | Status: DC
Start: 2019-12-16 — End: 2020-11-05

## 2019-12-16 NOTE — Telephone Encounter (Signed)
Pt called stating that provider was going to send in a rx for the high dose vit D. Per pt, last levels were low. Pls send rx to [facility].

## 2019-12-25 ENCOUNTER — Ambulatory Visit
Admission: RE | Admit: 2019-12-25 | Discharge: 2019-12-25 | Disposition: A | Discharge status: Home / Self Care | Payer: Managed Care, Other (non HMO) | Source: Ambulatory Visit | Attending: Osteopathic Medicine | Admitting: Osteopathic Medicine

## 2019-12-25 ENCOUNTER — Other Ambulatory Visit: Payer: Self-pay

## 2019-12-25 DIAGNOSIS — Z1231 Encounter for screening mammogram for malignant neoplasm of breast: Secondary | ICD-10-CM

## 2020-01-31 ENCOUNTER — Encounter: Payer: Self-pay | Admitting: Osteopathic Medicine

## 2020-05-01 ENCOUNTER — Telehealth: Payer: Self-pay | Admitting: Osteopathic Medicine

## 2020-05-01 DIAGNOSIS — L989 Disorder of the skin and subcutaneous tissue, unspecified: Secondary | ICD-10-CM

## 2020-05-01 NOTE — Telephone Encounter (Signed)
Dr. Steward Ros called she would like a referral to [facility] for a general skin cancer check. She scheduled with you for an appointment due to having itching on her back but wanted to have her referral placed now since [place] is scheduling out until July or August that way she could go ahead and get an appointment. - CF

## 2020-05-04 NOTE — Telephone Encounter (Signed)
Order placed

## 2020-05-13 ENCOUNTER — Ambulatory Visit (INDEPENDENT_AMBULATORY_CARE_PROVIDER_SITE_OTHER): Payer: Managed Care, Other (non HMO) | Admitting: Osteopathic Medicine

## 2020-05-13 ENCOUNTER — Other Ambulatory Visit: Payer: Self-pay

## 2020-05-13 ENCOUNTER — Encounter: Payer: Self-pay | Admitting: Osteopathic Medicine

## 2020-05-13 VITALS — BP 115/76 | HR 71 | Temp 98.3°F | Wt 174.1 lb

## 2020-05-13 DIAGNOSIS — L509 Urticaria, unspecified: Secondary | ICD-10-CM

## 2020-05-14 NOTE — Progress Notes (Signed)
Teresa Sullivan is a 60 y.o. female who presents to  [place] at [facility]  today, 05/13/20, seeking care for the following:  . Skin concern - itching on upper back for few weeks, just wanted to get this looked at, no rash but can't really see. Normal skin on exam.      ASSESSMENT & PLAN with other pertinent findings:  The encounter diagnosis was Urticaria.   Advised moisturizer hypoallergenic, no scrubbing/exfoliation or hot water, can trial OTC antihistamine if desired. RTC if worse/change or if no better.   There are no Patient Instructions on file for this visit.  No orders of the defined types were placed in this encounter.   No orders of the defined types were placed in this encounter.    See below for relevant physical exam findings  See below for recent lab and imaging results reviewed  Medications, allergies, PMH, PSH, SocH, FamH reviewed below    Follow-up instructions: Return recheck as needed.                                        Exam:  BP 115/76 (BP Location: Left Arm, Patient Position: Sitting, Cuff Size: Normal)   Pulse 71   Temp 98.3 F (36.8 C) (Oral)   Wt 174 lb 1.9 oz (79 kg)   BMI 26.47 kg/m   Constitutional: VS see above. General Appearance: alert, well-developed, well-nourished, NAD  Neck: No masses, trachea midline.   Respiratory: Normal respiratory effort.   Musculoskeletal: Gait normal.  Neurological: Normal balance/coordination. No tremor.  Skin: warm, dry, intact.   Psychiatric: Normal judgment/insight. Normal mood and affect. Oriented x3.   Current Meds  Medication Sig  . Emollient (ROC RETINOL CORREXION NIGHT) CREA Apply 1 application topically at bedtime.  Marland Kitchen estradiol (VIVELLE-DOT) 0.025 MG/24HR APPLY 1 PATCH EXTERNALLY TO THE SKIN 2 TIMES A WEEK  . fluticasone (FLONASE) 50 MCG/ACT nasal spray Place 2 sprays into both nostrils daily.  Marland Kitchen  levothyroxine (SYNTHROID) 75 MCG tablet Take 1 tablet (75 mcg total) by mouth daily before breakfast.  . meloxicam (MOBIC) 15 MG tablet One tab PO qAM with a meal for 2 weeks, then daily prn pain.  . Vitamin D, Ergocalciferol, (DRISDOL) 1.25 MG (50000 UNIT) CAPS capsule Take 1 capsule (50,000 Units total) by mouth every 7 (seven) days. Take for 12 total doses(weeks) than can transition to 2000 units OTC supplement daily    No Known Allergies  Patient Active Problem List   Diagnosis Date Noted  . Left anterior shoulder pain 03/19/2019  . Hypothyroidism 09/08/2014  . Hormone replacement therapy (postmenopausal) 09/08/2014  . History of cyst of breast 09/08/2014  . History of hysterectomy for benign disease 09/08/2014  . History of colonoscopy 09/08/2014    Family History  Problem Relation Age of Onset  . Stroke Mother   . Aneurysm Mother   . Diabetes Father   . Heart disease Father   . Diabetes Paternal Aunt     Social History   Tobacco Use  Smoking Status Never Smoker  Smokeless Tobacco Never Used    Past Surgical History:  Procedure Laterality Date  . ABDOMINAL HYSTERECTOMY  04-08-09   total hysterectomy Dr Neale Burly  . BREAST EXCISIONAL BIOPSY Left 1999   benign  . BREAST SURGERY      Immunization History  Administered Date(s) Administered  .  PFIZER(Purple Top)SARS-COV-2 Vaccination 04/22/2019, 05/14/2019  . Zoster Recombinat (Shingrix) 11/16/2018, 01/18/2019    No results found for this or any previous visit (from the past 2160 hour(s)).  No results found.     All questions at time of visit were answered - patient instructed to contact office with any additional concerns or updates. ER/RTC precautions were reviewed with the patient as applicable.   Please note: manual typing as well as voice recognition software may have been used to produce this document - typos may escape review. Please contact Dr. Sheppard Coil for any needed clarifications.

## 2020-09-02 ENCOUNTER — Other Ambulatory Visit: Payer: Self-pay

## 2020-09-02 ENCOUNTER — Encounter: Payer: Self-pay | Admitting: Osteopathic Medicine

## 2020-09-02 ENCOUNTER — Ambulatory Visit: Payer: Managed Care, Other (non HMO) | Admitting: Osteopathic Medicine

## 2020-09-02 VITALS — BP 120/64 | HR 65 | Temp 98.3°F | Wt 175.1 lb

## 2020-09-02 DIAGNOSIS — R0989 Other specified symptoms and signs involving the circulatory and respiratory systems: Secondary | ICD-10-CM

## 2020-09-02 DIAGNOSIS — R198 Other specified symptoms and signs involving the digestive system and abdomen: Secondary | ICD-10-CM

## 2020-09-02 LAB — CBC WITH DIFFERENTIAL/PLATELET
Absolute Monocytes: 495 cells/uL (ref 200–950)
Basophils Absolute: 39 cells/uL (ref 0–200)
Basophils Relative: 0.7 %
Eosinophils Absolute: 132 cells/uL (ref 15–500)
Eosinophils Relative: 2.4 %
HCT: 42.8 % (ref 35.0–45.0)
Hemoglobin: 14.6 g/dL (ref 11.7–15.5)
Lymphs Abs: 1447 cells/uL (ref 850–3900)
MCH: 31.1 pg (ref 27.0–33.0)
MCHC: 34.1 g/dL (ref 32.0–36.0)
MCV: 91.1 fL (ref 80.0–100.0)
MPV: 10.2 fL (ref 7.5–12.5)
Monocytes Relative: 9 %
Neutro Abs: 3388 cells/uL (ref 1500–7800)
Neutrophils Relative %: 61.6 %
Platelets: 308 10*3/uL (ref 140–400)
RBC: 4.7 10*6/uL (ref 3.80–5.10)
RDW: 11.7 % (ref 11.0–15.0)
Total Lymphocyte: 26.3 %
WBC: 5.5 10*3/uL (ref 3.8–10.8)

## 2020-09-02 NOTE — Patient Instructions (Addendum)
Labs today to eval blood counts and thyroid levels  ENT referral, likely will discuss scope to visualize further into the throat  Annual when due, any provider here is ok!   FYI to my patients: After six years here, I will be leaving practice at [facility]. My last day here will be 10/09/2020. I will continue to provide your care up until that date, and you will still be considered a patient here after that as long as you want to be!    You will get a letter in the mail explaining details, but after 10/09/2020, my patients have several options to continue care:  1) you can establish care with Dr. Luetta Nutting or Samuel Bouche NP, who are accepting new patients here and are absorbing many folks from my current patient panel, OR...  2) you can see any available provider here on as-needed basis until my official replacement starts (hiring a new doctor has not been finalized yet), OR.Marland KitchenMarland Kitchen 3) if you choose to seek care elsewhere, this office will be happy to facilitate transfer of records, and will refill medications on a case-by-case basis.   It is bittersweet to leave! I will be practicing inpatient hospital medicine at [facility], continuing to serve as chair for [place], and I will also be teaching medical learners. I have truly enjoyed taking care of folks here, but I am also excited for my next adventure doing something a bit different. Take care, and please let us know if you have any questions!   -Dr. Loni Muse.

## 2020-09-02 NOTE — Progress Notes (Signed)
Teresa Sullivan is a 60 y.o. female who presents to  [place] at [facility]  today, 09/02/20, seeking care for the following:  Globus sensation - feeling of lump in anterior throat near cricoid, no dysphagia, no foul odor to breath, no choking. No triggers for this sensation, seems to come and go on its own. Nonsmoker. Reports had bad laryngitis w/ COVID infection 01/2020 and since then has felt more sore throat than usual.      ASSESSMENT & PLAN with other pertinent findings:  The encounter diagnosis was Globus sensation. Ddx mass or scar tissue possibly related to COVID, function problem, she is low risk for malignancy   Physical exam neck/oropharynx is normal, normal neck ROM. No lymphadenopathy. Refer to ENT, likely would benefit from eval w/ scope. No dysphagia to necessitate GI referral for EGD at this point.   Patient Instructions  Labs today to eval blood counts and thyroid levels  ENT referral, likely will discuss scope to visualize further into the throat  Annual when due, any provider here is ok!     Orders Placed This Encounter  Procedures   CBC With Differential   TSH   CBC with Differential/Platelet   Ambulatory referral to ENT    No orders of the defined types were placed in this encounter.    See below for relevant physical exam findings  See below for recent lab and imaging results reviewed  Medications, allergies, PMH, PSH, SocH, FamH reviewed below    Follow-up instructions: Return if symptoms worsen or fail to improve.                                        Exam:  BP 120/64 (BP Location: Left Arm, Patient Position: Sitting, Cuff Size: Normal)   Pulse 65   Temp 98.3 F (36.8 C) (Oral)   Wt 175 lb 1.9 oz (79.4 kg)   BMI 26.63 kg/m  Constitutional: VS see above. General Appearance: alert, well-developed, well-nourished, NAD Neck: No masses, trachea midline.   Respiratory: Normal respiratory effort.  Musculoskeletal: Gait normal. Symmetric and independent movement of all extremities Neurological: Normal balance/coordination. No tremor. Skin: warm, dry, intact.  Psychiatric: Normal judgment/insight. Normal mood and affect. Oriented x3.   Current Meds  Medication Sig   Emollient (ROC RETINOL CORREXION NIGHT) CREA Apply 1 application topically at bedtime.   estradiol (VIVELLE-DOT) 0.025 MG/24HR APPLY 1 PATCH EXTERNALLY TO THE SKIN 2 TIMES A WEEK   fluticasone (FLONASE) 50 MCG/ACT nasal spray Place 2 sprays into both nostrils daily.   levothyroxine (SYNTHROID) 75 MCG tablet Take 1 tablet (75 mcg total) by mouth daily before breakfast.   meloxicam (MOBIC) 15 MG tablet One tab PO qAM with a meal for 2 weeks, then daily prn pain.   Vitamin D, Ergocalciferol, (DRISDOL) 1.25 MG (50000 UNIT) CAPS capsule Take 1 capsule (50,000 Units total) by mouth every 7 (seven) days. Take for 12 total doses(weeks) than can transition to 2000 units OTC supplement daily    No Known Allergies  Patient Active Problem List   Diagnosis Date Noted   Left anterior shoulder pain 03/19/2019   Hypothyroidism 09/08/2014   Hormone replacement therapy (postmenopausal) 09/08/2014   History of cyst of breast 09/08/2014   History of hysterectomy for benign disease 09/08/2014   History of colonoscopy 09/08/2014    Family History  Problem Relation Age  of Onset   Stroke Mother    Aneurysm Mother    Diabetes Father    Heart disease Father    Diabetes Paternal Aunt     Social History   Tobacco Use  Smoking Status Never  Smokeless Tobacco Never    Past Surgical History:  Procedure Laterality Date   ABDOMINAL HYSTERECTOMY  04-08-09   total hysterectomy Dr Neale Burly   BREAST EXCISIONAL BIOPSY Left 1999   benign   [place]      Immunization History  Administered Date(s) Administered   PFIZER(Purple Top)SARS-COV-2 Vaccination 04/22/2019, 05/14/2019   Zoster  Recombinat (Shingrix) 11/16/2018, 01/18/2019    No results found for this or any previous visit (from the past 2160 hour(s)).  No results found.     All questions at time of visit were answered - patient instructed to contact office with any additional concerns or updates. ER/RTC precautions were reviewed with the patient as applicable.   Please note: manual typing as well as voice recognition software may have been used to produce this document - typos may escape review. Please contact Dr. Sheppard Coil for any needed clarifications.

## 2020-09-03 LAB — TSH: TSH: 1.02 mIU/L (ref 0.40–4.50)

## 2020-09-10 ENCOUNTER — Telehealth: Payer: Self-pay

## 2020-09-10 NOTE — Telephone Encounter (Signed)
Patient left a vm msg on 09/09/20 stating she has an appt with ENT specialist at the end of the month. Per patient, she does not want to wait for her appt. She is will to go to any other ENT, regardless if it is out of network.

## 2020-09-11 ENCOUNTER — Other Ambulatory Visit: Payer: Self-pay | Admitting: Osteopathic Medicine

## 2020-09-11 DIAGNOSIS — Z1231 Encounter for screening mammogram for malignant neoplasm of breast: Secondary | ICD-10-CM

## 2020-09-15 NOTE — Telephone Encounter (Signed)
Redid referral to [facility] to see if they can schedule her a sooner appt time .[id number]

## 2020-10-08 ENCOUNTER — Ambulatory Visit (INDEPENDENT_AMBULATORY_CARE_PROVIDER_SITE_OTHER): Payer: Managed Care, Other (non HMO) | Admitting: Otolaryngology

## 2020-10-20 ENCOUNTER — Ambulatory Visit: Payer: Managed Care, Other (non HMO) | Admitting: Dermatology

## 2020-10-20 ENCOUNTER — Other Ambulatory Visit: Payer: Self-pay

## 2020-10-20 DIAGNOSIS — L738 Other specified follicular disorders: Secondary | ICD-10-CM

## 2020-10-20 DIAGNOSIS — Z1283 Encounter for screening for malignant neoplasm of skin: Secondary | ICD-10-CM | POA: Diagnosis not present

## 2020-10-20 DIAGNOSIS — L821 Other seborrheic keratosis: Secondary | ICD-10-CM | POA: Diagnosis not present

## 2020-10-20 DIAGNOSIS — D1801 Hemangioma of skin and subcutaneous tissue: Secondary | ICD-10-CM | POA: Diagnosis not present

## 2020-10-27 ENCOUNTER — Encounter: Payer: Managed Care, Other (non HMO) | Admitting: Physician Assistant

## 2020-10-27 DIAGNOSIS — R7301 Impaired fasting glucose: Secondary | ICD-10-CM

## 2020-10-27 DIAGNOSIS — Z131 Encounter for screening for diabetes mellitus: Secondary | ICD-10-CM

## 2020-10-27 DIAGNOSIS — R7989 Other specified abnormal findings of blood chemistry: Secondary | ICD-10-CM

## 2020-10-27 DIAGNOSIS — Z0000 Encounter for general adult medical examination without abnormal findings: Secondary | ICD-10-CM

## 2020-10-27 DIAGNOSIS — Z13220 Encounter for screening for lipoid disorders: Secondary | ICD-10-CM

## 2020-11-02 ENCOUNTER — Encounter: Payer: Self-pay | Admitting: Dermatology

## 2020-11-02 NOTE — Progress Notes (Signed)
   New Patient   Subjective  Teresa Sullivan is a 60 y.o. female who presents for the following: Annual Exam (Here for skin exam. Concerns back. Patient had some itching x 4 months ago. No history of skin cancers. ).  General skin examination Location:  Duration:  Quality:  Associated Signs/Symptoms: Modifying Factors:  Severity:  Timing: Context:    The following portions of the chart were reviewed this encounter and updated as appropriate:  Tobacco  Allergies  Meds  Problems  Med Hx  Surg Hx  Fam Hx      Objective  Well appearing patient in no apparent distress; mood and affect are within normal limits. General skin examination, no atypical pigmented lesions or nonmelanoma skin cancer.  Head Full body sin check, sebaceous hyperplasia many on the face and she was advised she will get more. Right hip nonspecific bump. Very little tanning bed exposure. Chest lesion keratoses. Few Angiomas.     A full examination was performed including scalp, head, eyes, ears, nose, lips, neck, chest, axillae, abdomen, back, buttocks, bilateral upper extremities, bilateral lower extremities, hands, feet, fingers, toes, fingernails, and toenails. All findings within normal limits unless otherwise noted below.  Areas beneath undergarments not fully examined.   Assessment & Plan  Sebaceous hyperplasia of face Head  All lesions are safe to leave and keep yearly skin checks no skin cancer or atypia found.  Encounter for screening for malignant neoplasm of skin  Annual skin examination.  Encouraged to self examine twice annually.  Continued ultraviolet protection.

## 2020-11-04 ENCOUNTER — Ambulatory Visit (INDEPENDENT_AMBULATORY_CARE_PROVIDER_SITE_OTHER): Payer: Managed Care, Other (non HMO) | Admitting: Physician Assistant

## 2020-11-04 ENCOUNTER — Encounter: Payer: Self-pay | Admitting: Physician Assistant

## 2020-11-04 VITALS — BP 130/65 | HR 62 | Ht 68.0 in | Wt 178.0 lb

## 2020-11-04 DIAGNOSIS — Z0000 Encounter for general adult medical examination without abnormal findings: Secondary | ICD-10-CM | POA: Diagnosis not present

## 2020-11-04 DIAGNOSIS — Z13220 Encounter for screening for lipoid disorders: Secondary | ICD-10-CM | POA: Diagnosis not present

## 2020-11-04 DIAGNOSIS — Z1211 Encounter for screening for malignant neoplasm of colon: Secondary | ICD-10-CM

## 2020-11-04 DIAGNOSIS — E039 Hypothyroidism, unspecified: Secondary | ICD-10-CM

## 2020-11-04 DIAGNOSIS — Z79890 Hormone replacement therapy (postmenopausal): Secondary | ICD-10-CM

## 2020-11-04 DIAGNOSIS — J301 Allergic rhinitis due to pollen: Secondary | ICD-10-CM

## 2020-11-04 DIAGNOSIS — R7989 Other specified abnormal findings of blood chemistry: Secondary | ICD-10-CM

## 2020-11-04 DIAGNOSIS — Z131 Encounter for screening for diabetes mellitus: Secondary | ICD-10-CM

## 2020-11-04 DIAGNOSIS — E559 Vitamin D deficiency, unspecified: Secondary | ICD-10-CM

## 2020-11-04 DIAGNOSIS — R7301 Impaired fasting glucose: Secondary | ICD-10-CM

## 2020-11-04 MED ORDER — ESTRADIOL 0.025 MG/24HR TD PTTW: MEDICATED_PATCH | TRANSDERMAL | 3 refills | Status: DC

## 2020-11-04 MED ORDER — VITAMIN D (ERGOCALCIFEROL) 1.25 MG (50000 UNIT) PO CAPS: 50000.0000 [IU] | ORAL_CAPSULE | ORAL | 3 refills | Status: DC

## 2020-11-04 MED ORDER — FLUTICASONE PROPIONATE 50 MCG/ACT NA SUSP
2.0000 | Freq: Every day | NASAL | 3 refills | Status: DC
Start: 2020-11-04 — End: 2021-11-10

## 2020-11-04 MED ORDER — LEVOTHYROXINE SODIUM 75 MCG PO TABS: 75.0000 ug | ORAL_TABLET | Freq: Every day | ORAL | 3 refills | Status: DC

## 2020-11-04 NOTE — Patient Instructions (Signed)
Health Maintenance, Female Adopting a healthy lifestyle and getting preventive care are important in promoting health and wellness. Ask your health care provider about: The right schedule for you to have regular tests and exams. Things you can do on your own to prevent diseases and keep yourself healthy. What should I know about diet, weight, and exercise? Eat a healthy diet  Eat a diet that includes plenty of vegetables, fruits, low-fat dairy products, and lean protein. Do not eat a lot of foods that are high in solid fats, added sugars, or sodium. Maintain a healthy weight Body mass index (BMI) is used to identify weight problems. It estimates body fat based on height and weight. Your health care provider can help determine your BMI and help you achieve or maintain a healthy weight. Get regular exercise Get regular exercise. This is one of the most important things you can do for your health. Most adults should: Exercise for at least 150 minutes each week. The exercise should increase your heart rate and make you sweat (moderate-intensity exercise). Do strengthening exercises at least twice a week. This is in addition to the moderate-intensity exercise. Spend less time sitting. Even light physical activity can be beneficial. Watch cholesterol and blood lipids Have your blood tested for lipids and cholesterol at 61 years of age, then have this test every 5 years. Have your cholesterol levels checked more often if: Your lipid or cholesterol levels are high. You are older than 61 years of age. You are at high risk for heart disease. What should I know about cancer screening? Depending on your health history and family history, you may need to have cancer screening at various ages. This may include screening for: Breast cancer. Cervical cancer. Colorectal cancer. Skin cancer. Lung cancer. What should I know about heart disease, diabetes, and high blood pressure? Blood pressure and heart  disease High blood pressure causes heart disease and increases the risk of stroke. This is more likely to develop in people who have high blood pressure readings, are of African descent, or are overweight. Have your blood pressure checked: Every 3-5 years if you are 18-39 years of age. Every year if you are 40 years old or older. Diabetes Have regular diabetes screenings. This checks your fasting blood sugar level. Have the screening done: Once every three years after age 40 if you are at a normal weight and have a low risk for diabetes. More often and at a younger age if you are overweight or have a high risk for diabetes. What should I know about preventing infection? Hepatitis B If you have a higher risk for hepatitis B, you should be screened for this virus. Talk with your health care provider to find out if you are at risk for hepatitis B infection. Hepatitis C Testing is recommended for: Everyone born from 1945 through 1965. Anyone with known risk factors for hepatitis C. Sexually transmitted infections (STIs) Get screened for STIs, including gonorrhea and chlamydia, if: You are sexually active and are younger than 61 years of age. You are older than 61 years of age and your health care provider tells you that you are at risk for this type of infection. Your sexual activity has changed since you were last screened, and you are at increased risk for chlamydia or gonorrhea. Ask your health care provider if you are at risk. Ask your health care provider about whether you are at high risk for HIV. Your health care provider may recommend a prescription medicine   to help prevent HIV infection. If you choose to take medicine to prevent HIV, you should first get tested for HIV. You should then be tested every 3 months for as long as you are taking the medicine. Pregnancy If you are about to stop having your period (premenopausal) and you may become pregnant, seek counseling before you get  pregnant. Take 400 to 800 micrograms (mcg) of folic acid every day if you become pregnant. Ask for birth control (contraception) if you want to prevent pregnancy. Osteoporosis and menopause Osteoporosis is a disease in which the bones lose minerals and strength with aging. This can result in bone fractures. If you are 65 years old or older, or if you are at risk for osteoporosis and fractures, ask your health care provider if you should: Be screened for bone loss. Take a calcium or vitamin D supplement to lower your risk of fractures. Be given hormone replacement therapy (HRT) to treat symptoms of menopause. Follow these instructions at home: Lifestyle Do not use any products that contain nicotine or tobacco, such as cigarettes, e-cigarettes, and chewing tobacco. If you need help quitting, ask your health care provider. Do not use street drugs. Do not share needles. Ask your health care provider for help if you need support or information about quitting drugs. Alcohol use Do not drink alcohol if: Your health care provider tells you not to drink. You are pregnant, may be pregnant, or are planning to become pregnant. If you drink alcohol: Limit how much you use to 0-1 drink a day. Limit intake if you are breastfeeding. Be aware of how much alcohol is in your drink. In the [place] one drink equals one 12 oz bottle of beer (355 mL), one 5 oz glass of wine (148 mL), or one 1 oz glass of hard liquor (44 mL). General instructions Schedule regular health, dental, and eye exams. Stay current with your vaccines. Tell your health care provider if: You often feel depressed. You have ever been abused or do not feel safe at home. Summary Adopting a healthy lifestyle and getting preventive care are important in promoting health and wellness. Follow your health care provider's instructions about healthy diet, exercising, and getting tested or screened for diseases. Follow your health care provider's  instructions on monitoring your cholesterol and blood pressure. This information is not intended to replace advice given to you by your health care provider. Make sure you discuss any questions you have with your health care provider. Document Revised: 03/06/2020 Document Reviewed: 12/20/2017 Elsevier Patient Education  2022 Elsevier Inc.  

## 2020-11-04 NOTE — Progress Notes (Signed)
Subjective:     Teresa Sullivan is a 60 y.o. female and is here for a comprehensive physical exam. The patient reports no problems.  Social History   Socioeconomic History   Marital status: Single    Spouse name: Teresa Sullivan   Number of children: 2   Years of education: Bachelors    Highest education level: Not on file  Occupational History   Not on file  Tobacco Use   Smoking status: Never   Smokeless tobacco: Never  Substance and Sexual Activity   Alcohol use: Yes    Comment: 1 x week   Drug use: No   Sexual activity: Yes    Birth control/protection: None  Other Topics Concern   Not on file  Social History Narrative   Not on file   Social Determinants of Health   Financial Resource Strain: Not on file  Food Insecurity: Not on file  Transportation Needs: Not on file  Physical Activity: Not on file  Stress: Not on file  Social Connections: Not on file  Intimate Partner Violence: Not on file   Health Maintenance  Topic Date Due   COLONOSCOPY (Pts 45-66yrs Insurance coverage will need to be confirmed)  03/11/2019   COVID-19 Vaccine (3 - Booster for [place] series) 07/09/2019   INFLUENZA VACCINE  04/09/2021 (Originally 08/10/2020)   TETANUS/TDAP  05/13/2021 (Originally 01/06/1979)   MAMMOGRAM  12/24/2021   Hepatitis C Screening  Completed   HIV Screening  Completed   Zoster Vaccines- Shingrix  Completed   Pneumococcal Vaccine 80-72 Years old  Aged Out   HPV VACCINES  Aged Out    The following portions of the patient's history were reviewed and updated as appropriate: allergies, current medications, past family history, past medical history, past social history, past surgical history, and problem list.  Review of Systems A comprehensive review of systems was negative.   Objective:    BP 130/65   Pulse 62   Ht 5\' 8"  (1.727 m)   Wt 178 lb (80.7 kg)   SpO2 99%   BMI 27.06 kg/m  General appearance: alert, cooperative, and appears stated age Head:  Normocephalic, without obvious abnormality, atraumatic Eyes: conjunctivae/corneas clear. PERRL, EOM's intact. Fundi benign. Ears: normal TM's and external ear canals both ears Nose: Nares normal. Septum midline. Mucosa normal. No drainage or sinus tenderness. Throat: lips, mucosa, and tongue normal; teeth and gums normal Neck: no adenopathy, no carotid bruit, no JVD, supple, symmetrical, trachea midline, and thyroid not enlarged, symmetric, no tenderness/mass/nodules Back: symmetric, no curvature. ROM normal. No CVA tenderness. Lungs: clear to auscultation bilaterally Heart: regular rate and rhythm, S1, S2 normal, no murmur, click, rub or gallop Abdomen: soft, non-tender; bowel sounds normal; no masses,  no organomegaly Extremities: extremities normal, atraumatic, no cyanosis or edema Pulses: 2+ and symmetric Skin: Skin color, texture, turgor normal. No rashes or lesions Lymph nodes: Cervical, supraclavicular, and axillary nodes normal. Neurologic: Alert and oriented X 3, normal strength and tone. Normal symmetric reflexes. Normal coordination and gait   .Marland Kitchen Depression screen [facility] 2/9 11/04/2020 11/12/2019 10/19/2018 10/25/2017 09/05/2016  Decreased Interest 0 0 0 0 0  Down, Depressed, Hopeless 0 0 0 0 0  PHQ - 2 Score 0 0 0 0 0    Assessment:    Healthy female exam.     Plan:    Marland KitchenMarland KitchenMeliya was seen today for annual exam.  Diagnoses and all orders for this visit:  Annual physical exam -     TSH -  Lipid Panel w/reflex Direct LDL -     COMPLETE METABOLIC PANEL WITH GFR -     CBC with Differential/Platelet -     Hemoglobin A1c -     Vitamin D (25 hydroxy)  Hypothyroidism, unspecified type -     TSH -     levothyroxine (SYNTHROID) 75 MCG tablet; Take 1 tablet (75 mcg total) by mouth daily before breakfast.  Low vitamin D level -     Vitamin D (25 hydroxy)  Lipid screening -     Lipid Panel w/reflex Direct LDL  Diabetes mellitus screening -     COMPLETE METABOLIC PANEL  WITH GFR -     Hemoglobin A1c  Elevated fasting glucose -     COMPLETE METABOLIC PANEL WITH GFR -     Hemoglobin A1c  Hormone replacement therapy (postmenopausal) Comments: Refilled medications, has been on these about 4 or 5 years total. Advised one month of drug holiday, if doing well off medications can stop Orders: -     estradiol (VIVELLE-DOT) 0.025 MG/24HR; APPLY 1 PATCH EXTERNALLY TO THE SKIN 2 TIMES A WEEK  Colon cancer screening -     Ambulatory referral to Gastroenterology  Vitamin D deficiency -     Vitamin D (25 hydroxy) -     Vitamin D, Ergocalciferol, (DRISDOL) 1.25 MG (50000 UNIT) CAPS capsule; Take 1 capsule (50,000 Units total) by mouth every 7 (seven) days.  Non-seasonal allergic rhinitis due to pollen  Other orders -     fluticasone (FLONASE) 50 MCG/ACT nasal spray; Place 2 sprays into both nostrils daily.  .. Discussed 150 minutes of exercise a week.  Encouraged vitamin D 1000 units and Calcium 1300mg  or 4 servings of dairy a day.  PHQ no concerns.  Fasting labs ordered.  Mammogram scheduled.  Pap not indicated.  Colonoscopy ordered.  Covid vaccine x2.  Flu shot declined.  Shingrix completed.  Follow up in 1 year.    See After Visit Summary for Counseling Recommendations

## 2020-11-05 ENCOUNTER — Other Ambulatory Visit: Payer: Self-pay | Admitting: Physician Assistant

## 2020-11-05 LAB — CBC WITH DIFFERENTIAL/PLATELET
Absolute Monocytes: 389 cells/uL (ref 200–950)
Basophils Absolute: 38 cells/uL (ref 0–200)
Basophils Relative: 0.7 %
Eosinophils Absolute: 108 cells/uL (ref 15–500)
Eosinophils Relative: 2 %
HCT: 41.6 % (ref 35.0–45.0)
Hemoglobin: 13.9 g/dL (ref 11.7–15.5)
Lymphs Abs: 1469 cells/uL (ref 850–3900)
MCH: 30.3 pg (ref 27.0–33.0)
MCHC: 33.4 g/dL (ref 32.0–36.0)
MCV: 90.8 fL (ref 80.0–100.0)
MPV: 10.2 fL (ref 7.5–12.5)
Monocytes Relative: 7.2 %
Neutro Abs: 3397 cells/uL (ref 1500–7800)
Neutrophils Relative %: 62.9 %
Platelets: 302 10*3/uL (ref 140–400)
RBC: 4.58 10*6/uL (ref 3.80–5.10)
RDW: 11.7 % (ref 11.0–15.0)
Total Lymphocyte: 27.2 %
WBC: 5.4 10*3/uL (ref 3.8–10.8)

## 2020-11-05 LAB — COMPLETE METABOLIC PANEL WITH GFR
AG Ratio: 2.4 (calc) (ref 1.0–2.5)
ALT: 47 U/L — ABNORMAL HIGH (ref 6–29)
AST: 30 U/L (ref 10–35)
Albumin: 5 g/dL (ref 3.6–5.1)
Alkaline phosphatase (APISO): 70 U/L (ref 37–153)
BUN: 12 mg/dL (ref 7–25)
CO2: 24 mmol/L (ref 20–32)
Calcium: 9.5 mg/dL (ref 8.6–10.4)
Chloride: 105 mmol/L (ref 98–110)
Creat: 0.7 mg/dL (ref 0.50–1.05)
Globulin: 2.1 g/dL (calc) (ref 1.9–3.7)
Glucose, Bld: 99 mg/dL (ref 65–99)
Potassium: 4.8 mmol/L (ref 3.5–5.3)
Sodium: 141 mmol/L (ref 135–146)
Total Bilirubin: 1.2 mg/dL (ref 0.2–1.2)
Total Protein: 7.1 g/dL (ref 6.1–8.1)
eGFR: 99 mL/min/{1.73_m2} (ref >=60)

## 2020-11-05 LAB — TSH: TSH: 1.11 mIU/L (ref 0.40–4.50)

## 2020-11-05 LAB — LIPID PANEL W/REFLEX DIRECT LDL
Cholesterol: 220 mg/dL — ABNORMAL HIGH (ref <200)
HDL: 63 mg/dL (ref >=50)
LDL Cholesterol (Calc): 134 mg/dL (calc) — ABNORMAL HIGH
Non-HDL Cholesterol (Calc): 157 mg/dL (calc) — ABNORMAL HIGH (ref <130)
Total CHOL/HDL Ratio: 3.5 (calc) (ref <5.0)
Triglycerides: 123 mg/dL (ref <150)

## 2020-11-05 LAB — HEMOGLOBIN A1C
Hgb A1c MFr Bld: 5.6 % of total Hgb (ref <5.7)
Mean Plasma Glucose: 114 mg/dL
eAG (mmol/L): 6.3 mmol/L

## 2020-11-05 LAB — VITAMIN D 25 HYDROXY (VIT D DEFICIENCY, FRACTURES): Vit D, 25-Hydroxy: 26 ng/mL — ABNORMAL LOW (ref 30–100)

## 2020-11-05 NOTE — Progress Notes (Signed)
Doretta,   Thyroid looks great.  Hemoglobin looks good.  A1C normal and stable from 1 year ago. It is getting close to pre-diabetic range. Watch your sugars/carbs now and try to get regular exercise.  Vitamin D is low. Vitamin D weekly has already been sent.   Cholesterol still not resulted.

## 2020-11-06 ENCOUNTER — Encounter: Payer: Self-pay | Admitting: Physician Assistant

## 2020-11-06 DIAGNOSIS — E7800 Pure hypercholesterolemia, unspecified: Secondary | ICD-10-CM | POA: Insufficient documentation

## 2020-11-06 NOTE — Progress Notes (Signed)
HDL, good cholesterol look great.  LDL, bad cholesterol is up some.  10 year risk is 3.3 percent which is under 7.5 percent. Continue to work on [employer] and exercise. Will recheck next year.

## 2020-12-07 ENCOUNTER — Other Ambulatory Visit: Payer: Self-pay | Admitting: Osteopathic Medicine

## 2020-12-07 DIAGNOSIS — Z79890 Hormone replacement therapy (postmenopausal): Secondary | ICD-10-CM

## 2020-12-25 ENCOUNTER — Ambulatory Visit
Admission: RE | Admit: 2020-12-25 | Discharge: 2020-12-25 | Disposition: A | Discharge status: Home / Self Care | Payer: Managed Care, Other (non HMO) | Source: Ambulatory Visit | Attending: Osteopathic Medicine | Admitting: Osteopathic Medicine

## 2020-12-25 ENCOUNTER — Other Ambulatory Visit: Payer: Self-pay

## 2020-12-25 DIAGNOSIS — Z1231 Encounter for screening mammogram for malignant neoplasm of breast: Secondary | ICD-10-CM

## 2020-12-28 NOTE — Progress Notes (Signed)
Normal mammogram. Follow up in 1 year.

## 2021-06-11 ENCOUNTER — Ambulatory Visit: Payer: 59 | Admitting: Physician Assistant

## 2021-06-11 ENCOUNTER — Encounter: Payer: Self-pay | Admitting: Physician Assistant

## 2021-06-11 VITALS — BP 133/82 | HR 76 | Ht 68.0 in | Wt 179.0 lb

## 2021-06-11 DIAGNOSIS — L209 Atopic dermatitis, unspecified: Secondary | ICD-10-CM | POA: Diagnosis not present

## 2021-06-11 MED ORDER — CLOTRIMAZOLE-BETAMETHASONE 1-0.05 % EX CREA: 1.0000 "application " | TOPICAL_CREAM | Freq: Two times a day (BID) | CUTANEOUS | 0 refills | Status: DC

## 2021-06-11 NOTE — Progress Notes (Signed)
   Acute Office Visit  Subjective:     Patient ID: Teresa Sullivan, female    DOB: 12-06-1960, 61 y.o.   MRN: [id number]  Chief Complaint  Patient presents with   Rash    HPI Patient is in today for rash under left axilla for 1 month. It is irritating and "burns a little". She does note some mild itching. She has tried destin on it with no benefit. She has used the same deodorant and no injury. Not had anything like this before.   .. Active Ambulatory Problems    Diagnosis Date Noted   Hypothyroidism 09/08/2014   Hormone replacement therapy (postmenopausal) 09/08/2014   History of cyst of breast 09/08/2014   History of hysterectomy for benign disease 09/08/2014   History of colonoscopy 09/08/2014   Left anterior shoulder pain 03/19/2019   Elevated LDL cholesterol level 11/06/2020   Resolved Ambulatory Problems    Diagnosis Date Noted   No Resolved Ambulatory Problems   Past Medical History:  Diagnosis Date   Thyroid disease      ROS See HPI.      Objective:    BP 133/82   Pulse 76   Ht '5\' 8"'$  (1.727 m)   Wt 179 lb (81.2 kg)   SpO2 99%   BMI 27.22 kg/m    Physical Exam  Left axilla 2 linear macular peach appearance with fine scales in axilla crease     Assessment & Plan:  Marland KitchenMarland KitchenShereen was seen today for rash.  Diagnoses and all orders for this visit:  Atopic dermatitis, unspecified type -     clotrimazole-betamethasone (LOTRISONE) cream; Apply 1 application. topically 2 (two) times daily. For 2 weeks as needed.  Topical cream given to use for next 2 weeks and then as needed. Follow up if not improving.   Iran Planas, PA-C

## 2021-06-11 NOTE — Patient Instructions (Signed)
Atopic Dermatitis ?Atopic dermatitis is a skin disorder that causes inflammation of the skin. It is marked by a red rash and itchy, dry, scaly skin. It is the most common type of eczema. Eczema is a group of skin conditions that cause the skin to become rough and swollen. This condition is generally worse during the cooler winter months and often improves during the warm summer months. ?Atopic dermatitis usually starts showing signs in infancy and can last through adulthood. This condition cannot be passed from one person to another (is not contagious). Atopic dermatitis may not always be present, but when it is, it is called a flare-up. ?What are the causes? ?The exact cause of this condition is not known. Flare-ups may be triggered by: ?Coming in contact with something that you are sensitive or allergic to (allergen). ?Stress. ?Certain foods. ?Extremely hot or cold weather. ?Harsh chemicals and soaps. ?Dry air. ?Chlorine. ?What increases the risk? ?This condition is more likely to develop in people who have a personal or family history of: ?Eczema. ?Allergies. ?Asthma. ?Hay fever. ?What are the signs or symptoms? ?Symptoms of this condition include: ?Dry, scaly skin. ?Red, itchy rash. ?Itchiness, which can be severe. This may occur before the skin rash. This can make sleeping difficult. ?Skin thickening and cracking that can occur over time. ?How is this diagnosed? ?This condition is diagnosed based on: ?Your symptoms. ?Your medical history. ?A physical exam. ?How is this treated? ?There is no cure for this condition, but symptoms can usually be controlled. Treatment focuses on: ?Controlling the itchiness and scratching. You may be given medicines, such as antihistamines or steroid creams. ?Limiting exposure to allergens. ?Recognizing situations that cause stress and developing a plan to manage stress. ?If your atopic dermatitis does not get better with medicines, or if it is all over your body (widespread), a  treatment using a specific type of light (phototherapy) may be used. ?Follow these instructions at home: ?Skin care ? ?Keep your skin well moisturized. Doing this seals in moisture and helps to prevent dryness. ?Use unscented lotions that have petroleum in them. ?Avoid lotions that contain alcohol or water. They can dry the skin. ?Keep baths or showers short (less than 5 minutes) in warm water. Do not use hot water. ?Use mild, unscented cleansers for bathing. Avoid soap and bubble bath. ?Apply a moisturizer to your skin right after a bath or shower. ?Do not apply anything to your skin without checking with your health care provider. ?General instructions ?Take or apply over-the-counter and prescription medicines only as told by your health care provider. ?Dress in clothes made of cotton or cotton blends. Dress lightly because heat increases itchiness. ?When washing your clothes, rinse your clothes twice so all of the soap is removed. ?Avoid any triggers that can cause a flare-up. ?Keep your fingernails cut short. ?Avoid scratching. Scratching makes the rash and itchiness worse. A break in the skin from scratching could result in a skin infection (impetigo). ?Do not be around people who have cold sores or fever blisters. If you get the infection, it may cause your atopic dermatitis to worsen. ?Keep all follow-up visits. This is important. ?Contact a health care provider if: ?Your itchiness interferes with sleep. ?Your rash gets worse or is not better within one week of starting treatment. ?You have a fever. ?You have a rash flare-up after having contact with someone who has cold sores or fever blisters. ?Get help right away if: ?You develop pus or soft yellow scabs in the rash   area. ?Summary ?Atopic dermatitis causes a red rash and itchy, dry, scaly skin. ?Treatment focuses on controlling the itchiness and scratching, limiting exposure to things that you are sensitive or allergic to (allergens), recognizing  situations that cause stress, and developing a plan to manage stress. ?Keep your skin well moisturized. ?Keep baths or showers shorter than 5 minutes and use warm water. Do not use hot water. ?This information is not intended to replace advice given to you by your health care provider. Make sure you discuss any questions you have with your health care provider. ?Document Revised: 10/07/2019 Document Reviewed: 10/07/2019 ?Elsevier Patient Education ? 2023 Else[employer] ? ?

## 2021-06-16 ENCOUNTER — Ambulatory Visit: Payer: Managed Care, Other (non HMO) | Admitting: Physician Assistant

## 2021-09-17 ENCOUNTER — Other Ambulatory Visit: Payer: Self-pay | Admitting: Physician Assistant

## 2021-09-17 DIAGNOSIS — Z1231 Encounter for screening mammogram for malignant neoplasm of breast: Secondary | ICD-10-CM

## 2021-10-12 ENCOUNTER — Other Ambulatory Visit: Payer: Self-pay | Admitting: Physician Assistant

## 2021-10-12 DIAGNOSIS — E559 Vitamin D deficiency, unspecified: Secondary | ICD-10-CM

## 2021-10-20 ENCOUNTER — Ambulatory Visit: Payer: Managed Care, Other (non HMO) | Admitting: Dermatology

## 2021-11-10 ENCOUNTER — Ambulatory Visit (INDEPENDENT_AMBULATORY_CARE_PROVIDER_SITE_OTHER): Payer: 59 | Admitting: Physician Assistant

## 2021-11-10 ENCOUNTER — Encounter: Payer: Self-pay | Admitting: Physician Assistant

## 2021-11-10 VITALS — BP 122/68 | HR 60 | Ht 68.0 in | Wt 167.0 lb

## 2021-11-10 DIAGNOSIS — Z23 Encounter for immunization: Secondary | ICD-10-CM

## 2021-11-10 DIAGNOSIS — Z13820 Encounter for screening for osteoporosis: Secondary | ICD-10-CM

## 2021-11-10 DIAGNOSIS — E039 Hypothyroidism, unspecified: Secondary | ICD-10-CM | POA: Diagnosis not present

## 2021-11-10 DIAGNOSIS — Z0000 Encounter for general adult medical examination without abnormal findings: Secondary | ICD-10-CM | POA: Diagnosis not present

## 2021-11-10 DIAGNOSIS — Z131 Encounter for screening for diabetes mellitus: Secondary | ICD-10-CM

## 2021-11-10 DIAGNOSIS — R7989 Other specified abnormal findings of blood chemistry: Secondary | ICD-10-CM

## 2021-11-10 DIAGNOSIS — Z13220 Encounter for screening for lipoid disorders: Secondary | ICD-10-CM | POA: Diagnosis not present

## 2021-11-10 DIAGNOSIS — R09A2 Foreign body sensation, throat: Secondary | ICD-10-CM

## 2021-11-10 DIAGNOSIS — J310 Chronic rhinitis: Secondary | ICD-10-CM

## 2021-11-10 DIAGNOSIS — Z79890 Hormone replacement therapy (postmenopausal): Secondary | ICD-10-CM

## 2021-11-10 MED ORDER — OMEPRAZOLE 40 MG PO CPDR: 40.0000 mg | DELAYED_RELEASE_CAPSULE | Freq: Every day | ORAL | 3 refills | Status: DC

## 2021-11-10 MED ORDER — FLUTICASONE PROPIONATE 50 MCG/ACT NA SUSP: 2.0000 | Freq: Every day | NASAL | 3 refills | Status: DC

## 2021-11-10 MED ORDER — VIVELLE-DOT 0.025 MG/24HR TD PTTW: 1.0000 | MEDICATED_PATCH | TRANSDERMAL | 3 refills | Status: DC

## 2021-11-10 NOTE — Patient Instructions (Addendum)
Will schedule bone density with mammogram  Health Maintenance, Female Adopting a healthy lifestyle and getting preventive care are important in promoting health and wellness. Ask your health care provider about: The right schedule for you to have regular tests and exams. Things you can do on your own to prevent diseases and keep yourself healthy. What should I know about diet, weight, and exercise? Eat a healthy diet  Eat a diet that includes plenty of vegetables, fruits, low-fat dairy products, and lean protein. Do not eat a lot of foods that are high in solid fats, added sugars, or sodium. Maintain a healthy weight Body mass index (BMI) is used to identify weight problems. It estimates body fat based on height and weight. Your health care provider can help determine your BMI and help you achieve or maintain a healthy weight. Get regular exercise Get regular exercise. This is one of the most important things you can do for your health. Most adults should: Exercise for at least 150 minutes each week. The exercise should increase your heart rate and make you sweat (moderate-intensity exercise). Do strengthening exercises at least twice a week. This is in addition to the moderate-intensity exercise. Spend less time sitting. Even light physical activity can be beneficial. Watch cholesterol and blood lipids Have your blood tested for lipids and cholesterol at 62 years of age, then have this test every 5 years. Have your cholesterol levels checked more often if: Your lipid or cholesterol levels are high. You are older than 62 years of age. You are at high risk for heart disease. What should I know about cancer screening? Depending on your health history and family history, you may need to have cancer screening at various ages. This may include screening for: Breast cancer. Cervical cancer. Colorectal cancer. Skin cancer. Lung cancer. What should I know about heart disease, diabetes, and  high blood pressure? Blood pressure and heart disease High blood pressure causes heart disease and increases the risk of stroke. This is more likely to develop in people who have high blood pressure readings or are overweight. Have your blood pressure checked: Every 3-5 years if you are 41-65 years of age. Every year if you are 76 years old or older. Diabetes Have regular diabetes screenings. This checks your fasting blood sugar level. Have the screening done: Once every three years after age 60 if you are at a normal weight and have a low risk for diabetes. More often and at a younger age if you are overweight or have a high risk for diabetes. What should I know about preventing infection? Hepatitis B If you have a higher risk for hepatitis B, you should be screened for this virus. Talk with your health care provider to find out if you are at risk for hepatitis B infection. Hepatitis C Testing is recommended for: Everyone born from [birth year] through 1966. Anyone with known risk factors for hepatitis C. Sexually transmitted infections (STIs) Get screened for STIs, including gonorrhea and chlamydia, if: You are sexually active and are younger than 62 years of age. You are older than 62 years of age and your health care provider tells you that you are at risk for this type of infection. Your sexual activity has changed since you were last screened, and you are at increased risk for chlamydia or gonorrhea. Ask your health care provider if you are at risk. Ask your health care provider about whether you are at high risk for HIV. Your health care provider may recommend  a prescription medicine to help prevent HIV infection. If you choose to take medicine to prevent HIV, you should first get tested for HIV. You should then be tested every 3 months for as long as you are taking the medicine. Pregnancy If you are about to stop having your period (premenopausal) and you may become pregnant, seek counseling  before you get pregnant. Take 400 to 800 micrograms (mcg) of folic acid every day if you become pregnant. Ask for birth control (contraception) if you want to prevent pregnancy. Osteoporosis and menopause Osteoporosis is a disease in which the bones lose minerals and strength with aging. This can result in bone fractures. If you are 34 years old or older, or if you are at risk for osteoporosis and fractures, ask your health care provider if you should: Be screened for bone loss. Take a calcium or vitamin D supplement to lower your risk of fractures. Be given hormone replacement therapy (HRT) to treat symptoms of menopause. Follow these instructions at home: Alcohol use Do not drink alcohol if: Your health care provider tells you not to drink. You are pregnant, may be pregnant, or are planning to become pregnant. If you drink alcohol: Limit how much you have to: 0-1 drink a day. Know how much alcohol is in your drink. In the [place] one drink equals one 12 oz bottle of beer (355 mL), one 5 oz glass of wine (148 mL), or one 1 oz glass of hard liquor (44 mL). Lifestyle Do not use any products that contain nicotine or tobacco. These products include cigarettes, chewing tobacco, and vaping devices, such as e-cigarettes. If you need help quitting, ask your health care provider. Do not use street drugs. Do not share needles. Ask your health care provider for help if you need support or information about quitting drugs. General instructions Schedule regular health, dental, and eye exams. Stay current with your vaccines. Tell your health care provider if: You often feel depressed. You have ever been abused or do not feel safe at home. Summary Adopting a healthy lifestyle and getting preventive care are important in promoting health and wellness. Follow your health care provider's instructions about healthy diet, exercising, and getting tested or screened for diseases. Follow your health care  provider's instructions on monitoring your cholesterol and blood pressure. This information is not intended to replace advice given to you by your health care provider. Make sure you discuss any questions you have with your health care provider. Document Revised: 05/18/2020 Document Reviewed: 05/18/2020 Elsevier Patient Education  [place].

## 2021-11-10 NOTE — Addendum Note (Signed)
Addended byAnnamaria Helling on: 11/10/2021 09:45 AM   Modules accepted: Orders

## 2021-11-10 NOTE — Progress Notes (Signed)
Complete physical exam  Patient: Teresa Sullivan   DOB: [birth year]   61 y.o. Female  MRN: [id number]  Subjective:    Chief Complaint  Patient presents with   Annual Exam    Teresa Sullivan is a 61 y.o. female who presents today for a complete physical exam. She reports consuming a general diet.  She walks and rides bikes 3-4 times a week.  She generally feels well. She reports sleeping well. She does not have additional problems to discuss today.    Most recent fall risk assessment:    11/04/2020   10:06 AM  [place] in the past year? 0  Number falls in past yr: 0  Injury with Fall? 0  Risk for fall due to : No Fall Risks  Follow up Falls evaluation completed     Most recent depression screenings:    11/04/2020   10:06 AM 11/12/2019    8:26 AM  PHQ 2/9 Scores  PHQ - 2 Score 0 0    Vision:Within last year and Dental: No current dental problems and Receives regular dental care  Patient Active Problem List   Diagnosis Date Noted   Elevated LDL cholesterol level 11/06/2020   Left anterior shoulder pain 03/19/2019   Hypothyroidism 09/08/2014   Hormone replacement therapy (postmenopausal) 09/08/2014   History of cyst of breast 09/08/2014   History of hysterectomy for benign disease 09/08/2014   History of colonoscopy 09/08/2014   Past Medical History:  Diagnosis Date   History of colonoscopy 09/08/2014   2011    History of cyst of breast 09/08/2014   History of hysterectomy for benign disease 09/08/2014   ENDOMETRIOSIS    Hormone replacement therapy (postmenopausal) 09/08/2014   Hypothyroidism 09/08/2014   Thyroid disease    Family History  Problem Relation Age of Onset   Stroke Mother    Aneurysm Mother    Diabetes Father    Heart disease Father    Diabetes Paternal Aunt    No Known Allergies    Patient Care Team: Lavada Mesi as PCP - General (Family Medicine) Warren Danes, PA-C as Physician Assistant (Dermatology)    Outpatient Medications Prior to Visit  Medication Sig   clotrimazole-betamethasone (LOTRISONE) cream Apply 1 application. topically 2 (two) times daily. For 2 weeks as needed.   Emollient (ROC RETINOL CORREXION NIGHT) CREA Apply 1 application topically at bedtime.   estradiol (VIVELLE-DOT) 0.025 MG/24HR APPLY 1 PATCH EXTERNALLY TO THE SKIN TWO TIMES A WEEK   levothyroxine (SYNTHROID) 75 MCG tablet Take 1 tablet (75 mcg total) by mouth daily before breakfast.   Vitamin D, Ergocalciferol, (DRISDOL) 1.25 MG (50000 UNIT) CAPS capsule Take 1 capsule (50,000 Units total) by mouth every 7 (seven) days. Appt/labs for further refills   [DISCONTINUED] fluticasone (FLONASE) 50 MCG/ACT nasal spray Place 2 sprays into both nostrils daily.   [DISCONTINUED] meloxicam (MOBIC) 15 MG tablet One tab PO qAM with a meal for 2 weeks, then daily prn pain.   [DISCONTINUED] omeprazole (PRILOSEC) 40 MG capsule Take by mouth.   No facility-administered medications prior to visit.    Review of Systems  All other systems reviewed and are negative.         Objective:     BP 122/68   Pulse 60   Ht '5\' 8"'$  (1.727 m)   Wt 167 lb (75.8 kg)   SpO2 100%   BMI 25.39 kg/m  BP Readings from Last 3 Encounters:  11/10/21 122/68  06/11/21 133/82  11/04/20 130/65   Wt Readings from Last 3 Encounters:  11/10/21 167 lb (75.8 kg)  06/11/21 179 lb (81.2 kg)  11/04/20 178 lb (80.7 kg)      Physical Exam     BP 122/68   Pulse 60   Ht '5\' 8"'$  (1.727 m)   Wt 167 lb (75.8 kg)   SpO2 100%   BMI 25.39 kg/m   General Appearance:    Alert, cooperative, no distress, appears stated age  Head:    Normocephalic, without obvious abnormality, atraumatic  Eyes:    PERRL, conjunctiva/corneas clear, EOM's intact, fundi    benign, both eyes  Ears:    Normal TM's and external ear canals, both ears  Nose:   Nares normal, septum midline, mucosa normal, no drainage    or sinus tenderness  Throat:   Lips, mucosa, and tongue  normal; teeth and gums normal  Neck:   Supple, symmetrical, trachea midline, no adenopathy;    thyroid:  no enlargement/tenderness/nodules; no carotid   bruit or JVD  Back:     Symmetric, no curvature, ROM normal, no CVA tenderness  Lungs:     Clear to auscultation bilaterally, respirations unlabored  Chest Wall:    No tenderness or deformity   Heart:    Regular rate and rhythm, S1 and S2 normal, no murmur, rub   or gallop     Abdomen:     Soft, non-tender, bowel sounds active all four quadrants,    no masses, no organomegaly        Extremities:   Extremities normal, atraumatic, no cyanosis or edema  Pulses:   2+ and symmetric all extremities  Skin:   Skin color, texture, turgor normal, no rashes or lesions  Lymph nodes:   Cervical, supraclavicular, and axillary nodes normal  Neurologic:   CNII-XII intact, normal strength, sensation and reflexes    throughout   Assessment & Plan:    Routine Health Maintenance and Physical Exam  Immunization History  Administered Date(s) Administered   PFIZER(Purple Top)SARS-COV-2 Vaccination 04/22/2019, 05/14/2019   Zoster Recombinat (Shingrix) 11/16/2018, 01/18/2019    Health Maintenance  Topic Date Due   COLONOSCOPY (Pts 45-91yr Insurance coverage will need to be confirmed)  03/11/2019   COVID-19 Vaccine (3 - Pfizer series) 11/26/2021 (Originally 07/09/2019)   INFLUENZA VACCINE  04/10/2022 (Originally 08/10/2021)   TETANUS/TDAP  06/12/2022 (Originally 01/06/1979)   MAMMOGRAM  12/26/2022   Hepatitis C Screening  Completed   HIV Screening  Completed   Zoster Vaccines- Shingrix  Completed   HPV VACCINES  Aged Out    Discussed health benefits of physical activity, and encouraged her to engage in regular exercise appropriate for her age and condition.  .Teresa KitchenMateo Flowwas seen today for annual exam.  Diagnoses and all orders for this visit:  Annual physical exam -     TSH -     Lipid Panel w/reflex Direct LDL -     COMPLETE METABOLIC PANEL  WITH GFR -     CBC with Differential/Platelet -     Vitamin D (25 hydroxy) -     Ambulatory referral to Gastroenterology -     DG Bone Density; Future  Hypothyroidism, unspecified type -     TSH  Low vitamin D level -     Vitamin D (25 hydroxy)  Lipid screening -     Lipid Panel w/reflex Direct LDL  Diabetes mellitus screening -     COMPLETE METABOLIC PANEL  WITH GFR  Hormone replacement therapy (postmenopausal) Comments: Refilled medications, has been on these about 4 or 5 years total. Advised one month of drug holiday, if doing well off medications can stop Orders: -     DG Bone Density; Future -     VIVELLE-DOT 0.025 MG/24HR; Place 1 patch onto the skin 2 (two) times a week.  Osteoporosis screening -     DG Bone Density; Future  Chronic rhinitis -     fluticasone (FLONASE) 50 MCG/ACT nasal spray; Place 2 sprays into both nostrils daily.  Globus sensation -     omeprazole (PRILOSEC) 40 MG capsule; Take 1 capsule (40 mg total) by mouth daily.   .. Discussed 150 minutes of exercise a week.  Encouraged vitamin D 1000 units and Calcium '1300mg'$  or 4 servings of dairy a day.  Labs ordered PHQ no concerns Refilled medications Will adjust medications as dictated by labs Bone density due to post menopausal/vitamin D deficiency/long term use of PPI Mammogram scheduled Colonoscopy referral placed, discussed importance Tdap given today Declined covid/shingles/flu vaccine.   Return in about 1 year (around 11/11/2022).     Iran Planas, PA-C

## 2021-11-11 ENCOUNTER — Other Ambulatory Visit: Payer: Self-pay | Admitting: Physician Assistant

## 2021-11-11 NOTE — Progress Notes (Signed)
Teresa Sullivan,   Your vitamin D is MUCH better. You can just maintain on OTC D3 2000 units daily.  Thyroid normal range. Stay on same dose.  Liver enzyme improved.  Glucose just a little elevated. Will add A1c.  HDL, good cholesterol, looks good.  LDL, bad cholesterol, stable but not optimal.   10 year risk is under 7.5 percent. Continue to work on diet and exercise. Recheck in 1 year.   .The 10-year ASCVD risk score (Arnett DK, et al., 2019) is: 3.4%   Values used to calculate the score:     Age: 61 years     Sex: Female     Is Non-Hispanic African American: No     Diabetic: No     Tobacco smoker: No     Systolic Blood Pressure: 269 mmHg     Is BP treated: No     HDL Cholesterol: 57 mg/dL     Total Cholesterol: 214 mg/dL

## 2021-11-15 ENCOUNTER — Telehealth: Payer: Self-pay | Admitting: Neurology

## 2021-11-15 LAB — COMPLETE METABOLIC PANEL WITH GFR
AG Ratio: 1.7 (calc) (ref 1.0–2.5)
ALT: 31 U/L — ABNORMAL HIGH (ref 6–29)
AST: 22 U/L (ref 10–35)
Albumin: 4.7 g/dL (ref 3.6–5.1)
Alkaline phosphatase (APISO): 70 U/L (ref 37–153)
BUN: 17 mg/dL (ref 7–25)
CO2: 27 mmol/L (ref 20–32)
Calcium: 9.9 mg/dL (ref 8.6–10.4)
Chloride: 104 mmol/L (ref 98–110)
Creat: 0.78 mg/dL (ref 0.50–1.05)
Globulin: 2.7 g/dL (calc) (ref 1.9–3.7)
Glucose, Bld: 102 mg/dL — ABNORMAL HIGH (ref 65–99)
Potassium: 4.9 mmol/L (ref 3.5–5.3)
Sodium: 140 mmol/L (ref 135–146)
Total Bilirubin: 1.8 mg/dL — ABNORMAL HIGH (ref 0.2–1.2)
Total Protein: 7.4 g/dL (ref 6.1–8.1)
eGFR: 86 mL/min/{1.73_m2} (ref >=60)

## 2021-11-15 LAB — CBC WITH DIFFERENTIAL/PLATELET
Absolute Monocytes: 404 cells/uL (ref 200–950)
Basophils Absolute: 38 cells/uL (ref 0–200)
Basophils Relative: 0.8 %
Eosinophils Absolute: 99 cells/uL (ref 15–500)
Eosinophils Relative: 2.1 %
HCT: 41.2 % (ref 35.0–45.0)
Hemoglobin: 14.2 g/dL (ref 11.7–15.5)
Lymphs Abs: 1509 cells/uL (ref 850–3900)
MCH: 31.3 pg (ref 27.0–33.0)
MCHC: 34.5 g/dL (ref 32.0–36.0)
MCV: 90.7 fL (ref 80.0–100.0)
MPV: 10.6 fL (ref 7.5–12.5)
Monocytes Relative: 8.6 %
Neutro Abs: 2651 cells/uL (ref 1500–7800)
Neutrophils Relative %: 56.4 %
Platelets: 283 10*3/uL (ref 140–400)
RBC: 4.54 10*6/uL (ref 3.80–5.10)
RDW: 11.4 % (ref 11.0–15.0)
Total Lymphocyte: 32.1 %
WBC: 4.7 10*3/uL (ref 3.8–10.8)

## 2021-11-15 LAB — LIPID PANEL W/REFLEX DIRECT LDL
Cholesterol: 214 mg/dL — ABNORMAL HIGH (ref <200)
HDL: 57 mg/dL (ref >=50)
LDL Cholesterol (Calc): 133 mg/dL (calc) — ABNORMAL HIGH
Non-HDL Cholesterol (Calc): 157 mg/dL (calc) — ABNORMAL HIGH (ref <130)
Total CHOL/HDL Ratio: 3.8 (calc) (ref <5.0)
Triglycerides: 128 mg/dL (ref <150)

## 2021-11-15 LAB — TSH: TSH: 0.53 mIU/L (ref 0.40–4.50)

## 2021-11-15 LAB — VITAMIN D 25 HYDROXY (VIT D DEFICIENCY, FRACTURES): Vit D, 25-Hydroxy: 70 ng/mL (ref 30–100)

## 2021-11-15 LAB — HEMOGLOBIN A1C W/OUT EAG: Hgb A1c MFr Bld: 5.6 % of total Hgb (ref <5.7)

## 2021-11-15 NOTE — Telephone Encounter (Signed)
Patient left vm stating she didn't see TDAP documented in chart and her A1C results were not in there.   I called patient, no answer, LVM letting her know Tdap is documented. Also A1C is steady at 5.6 from last year. To call with any other questions.

## 2021-12-08 ENCOUNTER — Ambulatory Visit (INDEPENDENT_AMBULATORY_CARE_PROVIDER_SITE_OTHER): Payer: 59

## 2021-12-08 DIAGNOSIS — Z780 Asymptomatic menopausal state: Secondary | ICD-10-CM

## 2021-12-08 DIAGNOSIS — Z13820 Encounter for screening for osteoporosis: Secondary | ICD-10-CM

## 2021-12-08 DIAGNOSIS — Z79890 Hormone replacement therapy (postmenopausal): Secondary | ICD-10-CM

## 2021-12-08 DIAGNOSIS — Z0000 Encounter for general adult medical examination without abnormal findings: Secondary | ICD-10-CM

## 2021-12-09 NOTE — Progress Notes (Signed)
Hi Teresa Sullivan, I am covering for [facility] while she is out of the office.  Your bone density shows a T score of -0.5.  This is considered in the normal range.  I would encourage you to continue with adequate intake of calcium and vitamin D daily in addition to weightbearing exercise to reduce your risk for further thinning of the bones.

## 2021-12-27 ENCOUNTER — Ambulatory Visit
Admission: RE | Admit: 2021-12-27 | Discharge: 2021-12-27 | Disposition: A | Discharge status: Home / Self Care | Payer: 59 | Source: Ambulatory Visit | Attending: Physician Assistant | Admitting: Physician Assistant

## 2021-12-27 DIAGNOSIS — Z1231 Encounter for screening mammogram for malignant neoplasm of breast: Secondary | ICD-10-CM

## 2021-12-28 NOTE — Progress Notes (Signed)
Normal mammogram. Follow up in 1 year.

## 2022-01-07 ENCOUNTER — Other Ambulatory Visit: Payer: Self-pay | Admitting: Physician Assistant

## 2022-01-07 DIAGNOSIS — E039 Hypothyroidism, unspecified: Secondary | ICD-10-CM

## 2022-02-14 LAB — HM COLONOSCOPY

## 2022-03-11 ENCOUNTER — Encounter: Payer: Self-pay | Admitting: Physician Assistant

## 2022-07-05 IMAGING — MG MM DIGITAL SCREENING BILAT W/ TOMO AND CAD
6 of 10 series · 6 of 30 positions shown · non-contrast
Comparison: Previous exam(s).

CLINICAL DATA: Screening.

EXAM:
DIGITAL SCREENING BILATERAL MAMMOGRAM WITH TOMOSYNTHESIS AND CAD
TECHNIQUE: Bilateral screening digital craniocaudal and mediolateral oblique
mammograms were obtained. Bilateral screening digital breast
tomosynthesis was performed. The images were evaluated with
computer-aided detection.

[L CC synth-2D]
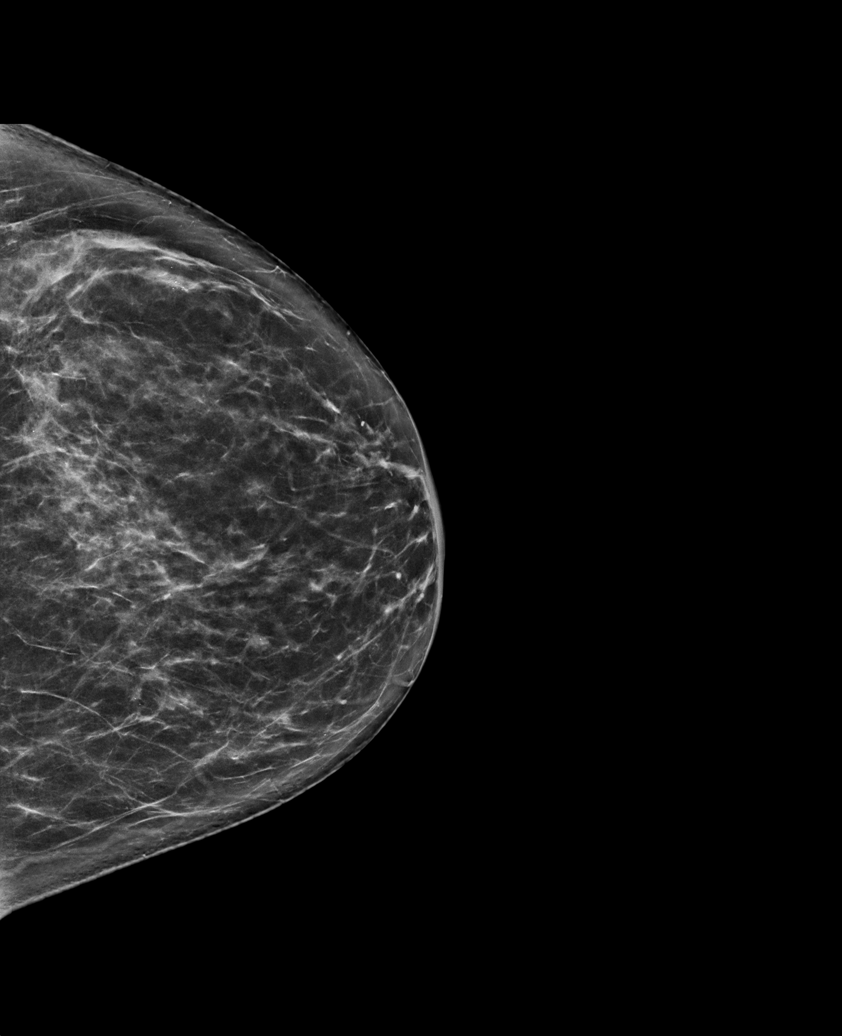

[L MLO synth-2D]
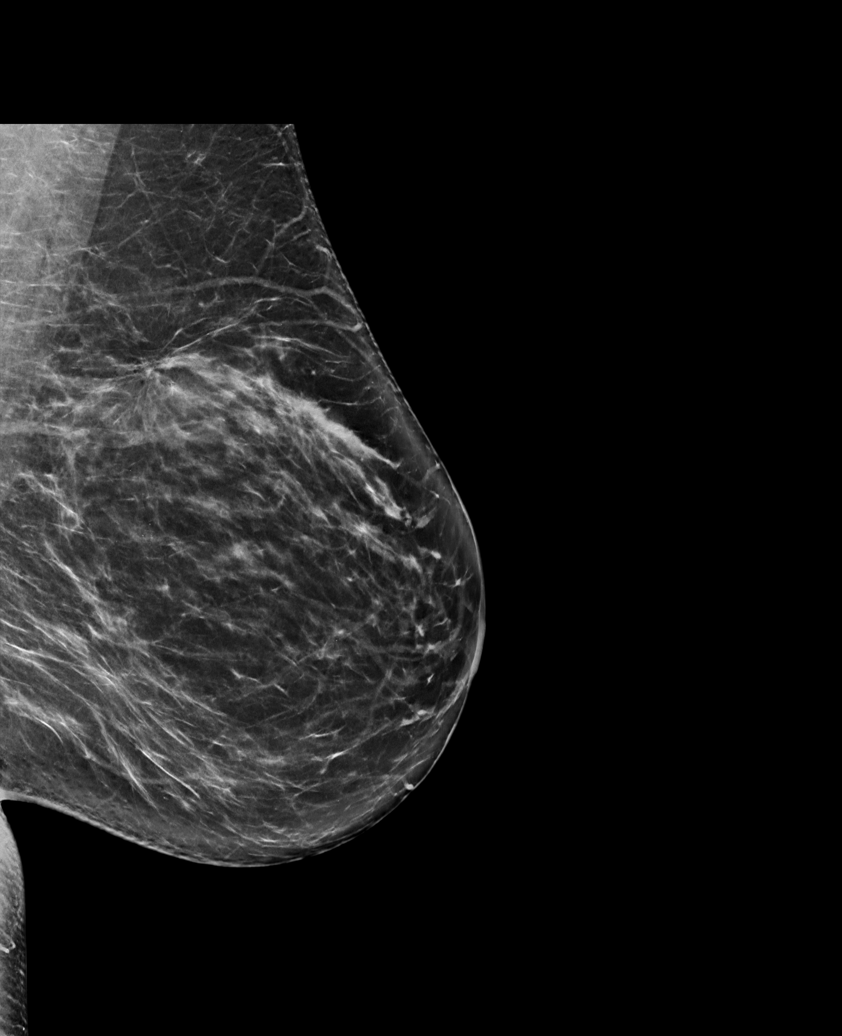

[R MLO synth-2D]
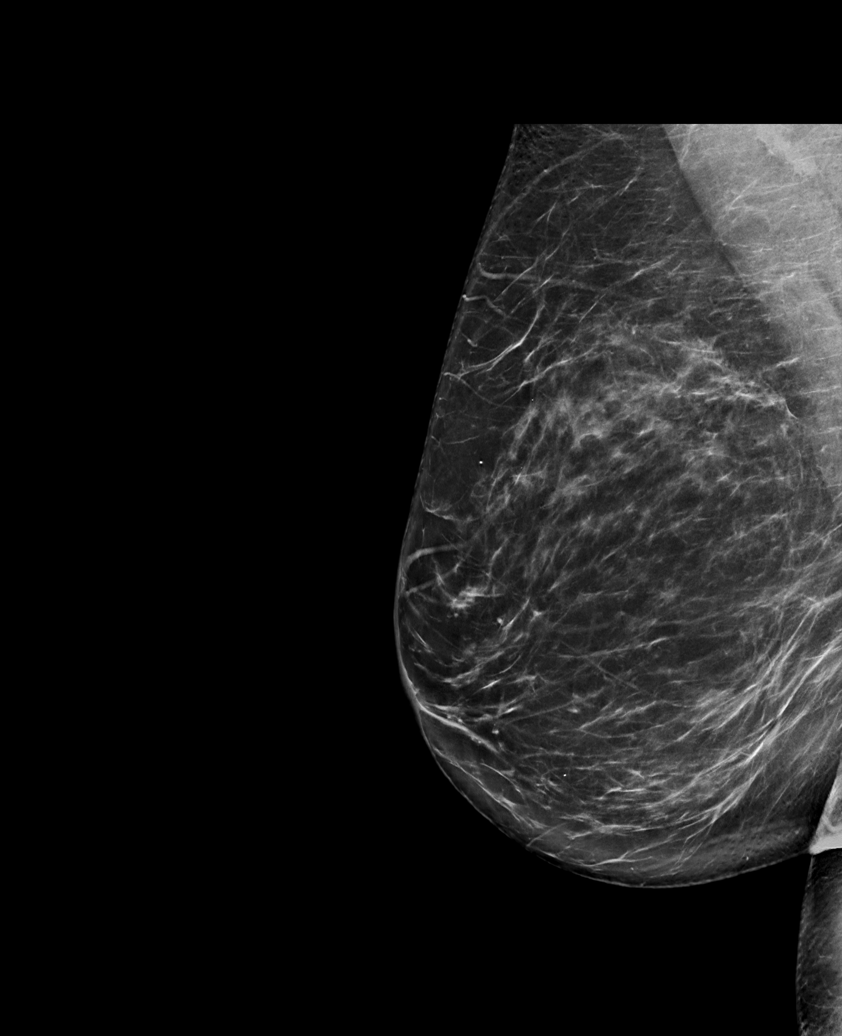

[R CC synth-2D]
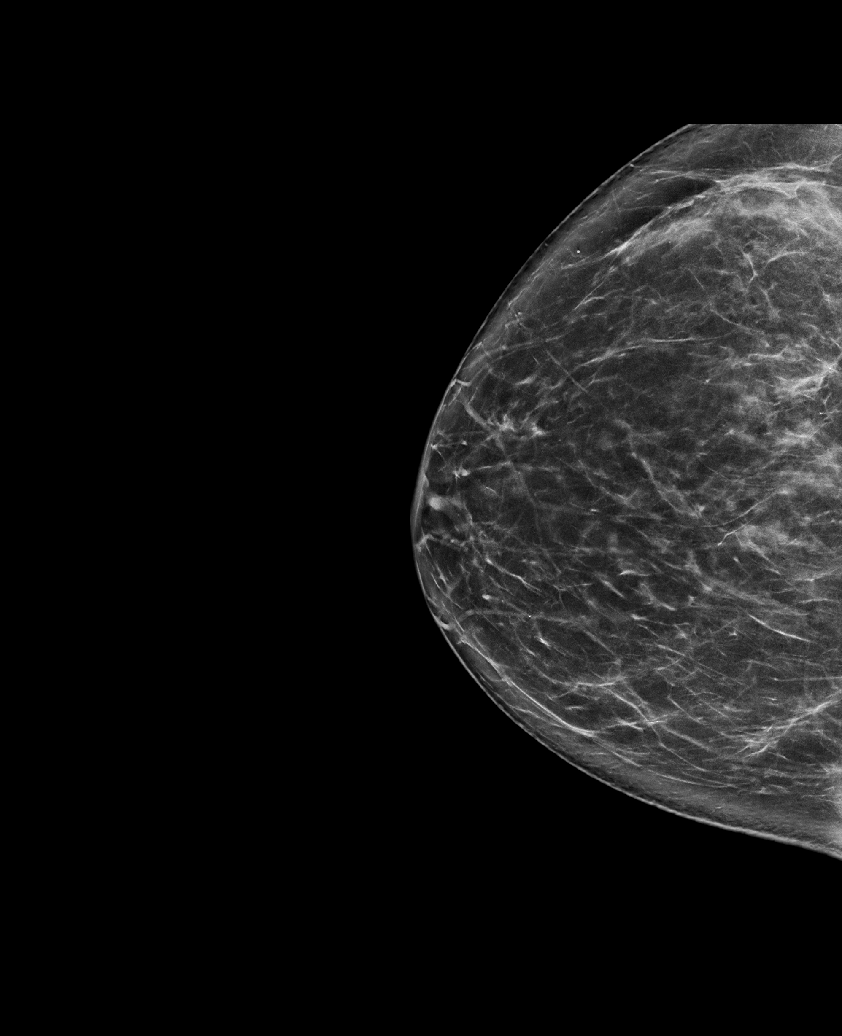

[R XCCL synth-2D]
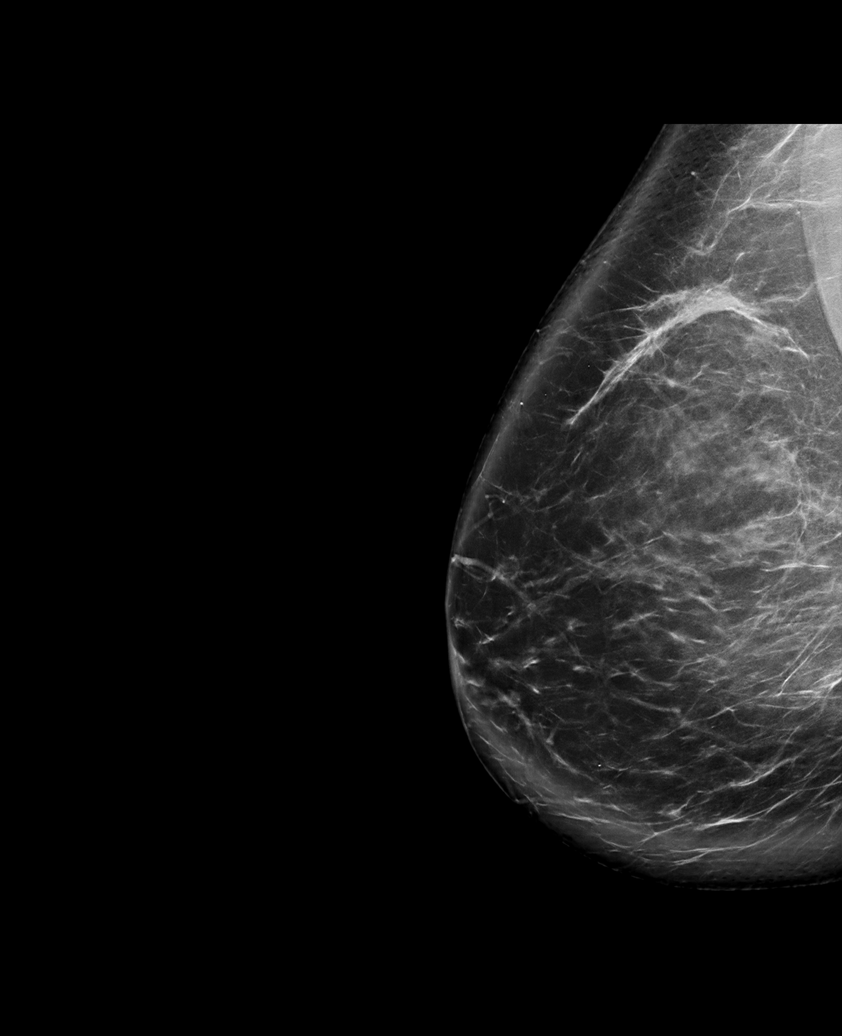

[R XCCL tomo · tomo slice 53/105.0]
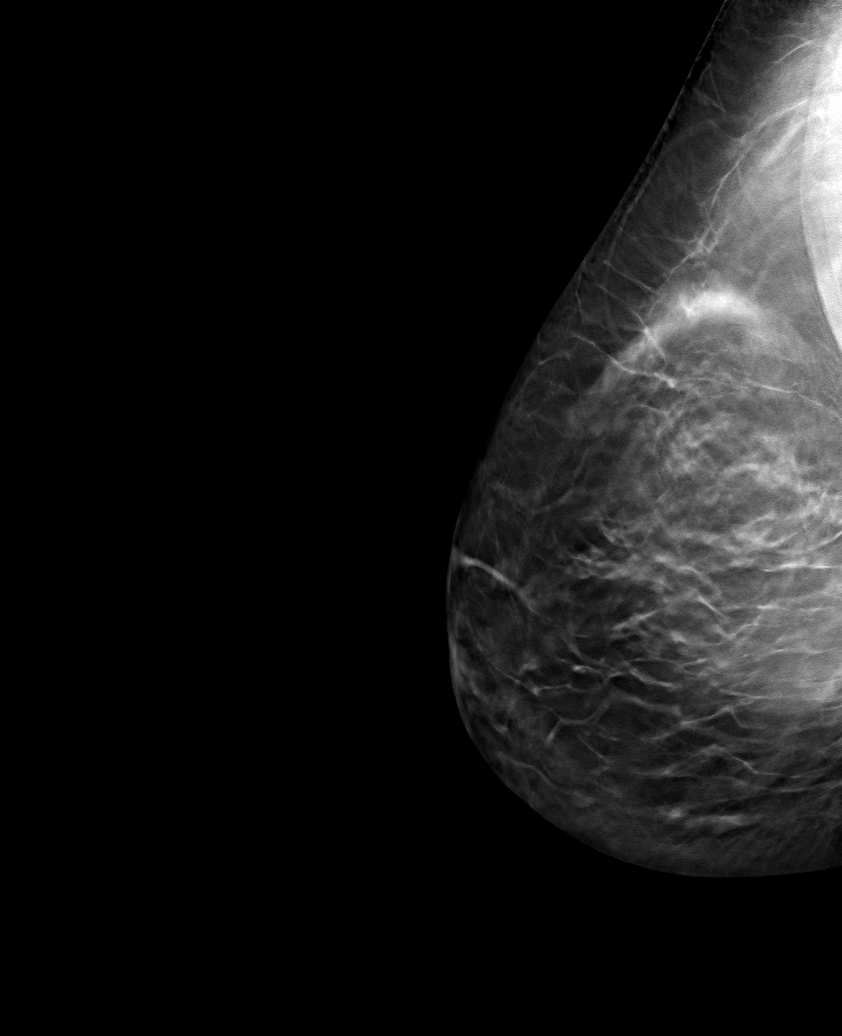

[6 of 30 positions shown; findings below may reference images not displayed]

ACR Breast Density Category b: There are scattered areas of
fibroglandular density.
FINDINGS: There are no findings suspicious for malignancy.
IMPRESSION: No mammographic evidence of malignancy. A result letter of this
screening mammogram will be mailed directly to the patient.

RECOMMENDATION:
Screening mammogram in one year. (Code:51-O-LD2)

BI-RADS CATEGORY  1: Negative.

## 2022-09-16 ENCOUNTER — Other Ambulatory Visit: Payer: Self-pay | Admitting: Physician Assistant

## 2022-09-16 DIAGNOSIS — Z1231 Encounter for screening mammogram for malignant neoplasm of breast: Secondary | ICD-10-CM

## 2022-10-18 ENCOUNTER — Telehealth: Payer: Self-pay | Admitting: Physician Assistant

## 2022-10-18 DIAGNOSIS — E7800 Pure hypercholesterolemia, unspecified: Secondary | ICD-10-CM

## 2022-10-18 DIAGNOSIS — Z780 Asymptomatic menopausal state: Secondary | ICD-10-CM

## 2022-10-18 DIAGNOSIS — Z0000 Encounter for general adult medical examination without abnormal findings: Secondary | ICD-10-CM

## 2022-10-18 DIAGNOSIS — E039 Hypothyroidism, unspecified: Secondary | ICD-10-CM

## 2022-10-18 DIAGNOSIS — R7301 Impaired fasting glucose: Secondary | ICD-10-CM

## 2022-10-18 DIAGNOSIS — E559 Vitamin D deficiency, unspecified: Secondary | ICD-10-CM

## 2022-10-18 NOTE — Telephone Encounter (Signed)
Patient is requesting lab work + vit d before her appointment 11/12

## 2022-10-19 NOTE — Telephone Encounter (Signed)
Labs ordered come any time fasting for 8 hours

## 2022-10-20 MED ORDER — VITAMIN D (ERGOCALCIFEROL) 1.25 MG (50000 UNIT) PO CAPS
50000.0000 [IU] | ORAL_CAPSULE | ORAL | 0 refills | Status: AC
Start: 2022-10-20 — End: ?

## 2022-10-20 NOTE — Addendum Note (Signed)
Addended by: Jomarie Longs on: 10/20/2022 11:32 AM   Modules accepted: Orders

## 2022-11-15 ENCOUNTER — Encounter: Payer: 59 | Admitting: Physician Assistant

## 2022-11-22 ENCOUNTER — Ambulatory Visit (INDEPENDENT_AMBULATORY_CARE_PROVIDER_SITE_OTHER): Payer: 59 | Admitting: Physician Assistant

## 2022-11-22 ENCOUNTER — Encounter: Payer: Self-pay | Admitting: Physician Assistant

## 2022-11-22 ENCOUNTER — Ambulatory Visit (INDEPENDENT_AMBULATORY_CARE_PROVIDER_SITE_OTHER): Payer: Self-pay

## 2022-11-22 VITALS — BP 123/63 | HR 63 | Ht 68.0 in | Wt 173.8 lb

## 2022-11-22 DIAGNOSIS — Z0000 Encounter for general adult medical examination without abnormal findings: Secondary | ICD-10-CM

## 2022-11-22 DIAGNOSIS — Z79890 Hormone replacement therapy (postmenopausal): Secondary | ICD-10-CM

## 2022-11-22 DIAGNOSIS — E7800 Pure hypercholesterolemia, unspecified: Secondary | ICD-10-CM

## 2022-11-22 DIAGNOSIS — R09A2 Foreign body sensation, throat: Secondary | ICD-10-CM

## 2022-11-22 DIAGNOSIS — Z8249 Family history of ischemic heart disease and other diseases of the circulatory system: Secondary | ICD-10-CM | POA: Diagnosis not present

## 2022-11-22 DIAGNOSIS — E039 Hypothyroidism, unspecified: Secondary | ICD-10-CM

## 2022-11-22 DIAGNOSIS — R21 Rash and other nonspecific skin eruption: Secondary | ICD-10-CM

## 2022-11-22 MED ORDER — TRIAMCINOLONE ACETONIDE 0.1 % EX CREA
1.0000 | TOPICAL_CREAM | Freq: Two times a day (BID) | CUTANEOUS | 0 refills | Status: AC
Start: 2022-11-22 — End: ?

## 2022-11-22 NOTE — Progress Notes (Signed)
Your Coronary Calcium Score is 0 GREAT news!!!

## 2022-11-22 NOTE — Patient Instructions (Signed)

## 2022-11-22 NOTE — Progress Notes (Unsigned)
Complete physical exam  Patient: Teresa Sullivan   DOB: 1961-01-01   62 y.o. Female  MRN: [id number]  Subjective:    Chief Complaint  Patient presents with   Annual Exam    Teresa Sullivan is a 62 y.o. female who presents today for a complete physical exam. She reports consuming a {diet types:17450} diet. {types:19826} She generally feels {DESC; WELL/FAIRLY WELL/POORLY:18703}. She reports sleeping {DESC; WELL/FAIRLY WELL/POORLY:18703}. She {does/does not:200015} have additional problems to discuss today.   Ride bike Father triple bypass   Most recent fall risk assessment:    11/22/2022    9:07 AM  Fall Risk   Falls in the past year? 0  Number falls in past yr: 0  Injury with Fall? 0  Risk for fall due to : No Fall Risks     Most recent depression screenings:    11/10/2021    9:43 AM 11/04/2020   10:06 AM  PHQ 2/9 Scores  PHQ - 2 Score 0 0    {VISON DENTAL STD PSA (Optional):27386}  {History (Optional):23778}  Patient Care Team: Nolene Ebbs as PCP - General (Family Medicine) Glyn Ade, PA-C as Physician Assistant (Dermatology)   Outpatient Medications Prior to Visit  Medication Sig   clotrimazole-betamethasone (LOTRISONE) cream Apply 1 application. topically 2 (two) times daily. For 2 weeks as needed.   Emollient (ROC RETINOL CORREXION NIGHT) CREA Apply 1 application topically at bedtime.   fluticasone (FLONASE) 50 MCG/ACT nasal spray Place 2 sprays into both nostrils daily.   levothyroxine (SYNTHROID) 75 MCG tablet TAKE 1 TABLET DAILY BEFORE BREAKFAST   Vitamin D, Ergocalciferol, (DRISDOL) 1.25 MG (50000 UNIT) CAPS capsule Take 1 capsule (50,000 Units total) by mouth every 7 (seven) days. Take for 8 total doses(weeks)   VIVELLE-DOT 0.025 MG/24HR Place 1 patch onto the skin 2 (two) times a week.   omeprazole (PRILOSEC) 40 MG capsule Take 1 capsule (40 mg total) by mouth daily.   No facility-administered medications prior to visit.     ROS        Objective:     BP 123/63 (BP Location: Left Arm, Patient Position: Sitting, Cuff Size: Large)   Pulse 63   Ht 5\' 8"  (1.727 m)   Wt 173 lb 12 oz (78.8 kg)   SpO2 100%   BMI 26.42 kg/m  {Vitals History (Optional):23777}  Physical Exam   No results found for any visits on 11/22/22. {Show previous labs (optional):23779}    Assessment & Plan:    Routine Health Maintenance and Physical Exam  Immunization History  Administered Date(s) Administered   PFIZER(Purple Top)SARS-COV-2 Vaccination 04/22/2019, 05/14/2019   Tdap 11/10/2021   Zoster Recombinant(Shingrix) 11/16/2018, 01/18/2019    Health Maintenance  Topic Date Due   COVID-19 Vaccine (3 - 2023-24 season) 12/08/2022 (Originally 09/11/2022)   INFLUENZA VACCINE  04/10/2023 (Originally 08/11/2022)   MAMMOGRAM  12/28/2023   DTaP/Tdap/Td (2 - Td or Tdap) 11/11/2031   Colonoscopy  02/15/2032   Hepatitis C Screening  Completed   HIV Screening  Completed   Zoster Vaccines- Shingrix  Completed   HPV VACCINES  Aged Out    Discussed health benefits of physical activity, and encouraged her to engage in regular exercise appropriate for her age and condition.  Problem List Items Addressed This Visit       Unprioritized   Hypothyroidism   Hormone replacement therapy (postmenopausal)   Other Visit Diagnoses     Routine physical examination    -  Primary      No follow-ups on file.     Tandy Gaw, PA-C

## 2022-11-23 ENCOUNTER — Encounter: Payer: Self-pay | Admitting: Physician Assistant

## 2022-11-23 DIAGNOSIS — R21 Rash and other nonspecific skin eruption: Secondary | ICD-10-CM | POA: Insufficient documentation

## 2022-11-23 LAB — CMP14+EGFR
ALT: 37 [IU]/L — ABNORMAL HIGH (ref 0–32)
AST: 24 [IU]/L (ref 0–40)
Albumin: 4.7 g/dL (ref 3.9–4.9)
Alkaline Phosphatase: 83 [IU]/L (ref 44–121)
BUN/Creatinine Ratio: 17 (ref 12–28)
BUN: 13 mg/dL (ref 8–27)
Bilirubin Total: 1.4 mg/dL — ABNORMAL HIGH (ref 0.0–1.2)
CO2: 22 mmol/L (ref 20–29)
Calcium: 9.5 mg/dL (ref 8.7–10.3)
Chloride: 103 mmol/L (ref 96–106)
Creatinine, Ser: 0.78 mg/dL (ref 0.57–1.00)
Globulin, Total: 2.6 g/dL (ref 1.5–4.5)
Glucose: 99 mg/dL (ref 70–99)
Potassium: 4.8 mmol/L (ref 3.5–5.2)
Sodium: 140 mmol/L (ref 134–144)
Total Protein: 7.3 g/dL (ref 6.0–8.5)
eGFR: 86 mL/min/{1.73_m2} (ref >59)

## 2022-11-23 LAB — CBC WITH DIFFERENTIAL/PLATELET
Basophils Absolute: 0 10*3/uL (ref 0.0–0.2)
Basos: 1 %
EOS (ABSOLUTE): 0.1 10*3/uL (ref 0.0–0.4)
Eos: 2 %
Hematocrit: 42.2 % (ref 34.0–46.6)
Hemoglobin: 14 g/dL (ref 11.1–15.9)
Immature Grans (Abs): 0 10*3/uL (ref 0.0–0.1)
Immature Granulocytes: 0 %
Lymphocytes Absolute: 1.5 10*3/uL (ref 0.7–3.1)
Lymphs: 29 %
MCH: 31.1 pg (ref 26.6–33.0)
MCHC: 33.2 g/dL (ref 31.5–35.7)
MCV: 94 fL (ref 79–97)
Monocytes Absolute: 0.4 10*3/uL (ref 0.1–0.9)
Monocytes: 8 %
Neutrophils Absolute: 3.1 10*3/uL (ref 1.4–7.0)
Neutrophils: 60 %
Platelets: 280 10*3/uL (ref 150–450)
RBC: 4.5 x10E6/uL (ref 3.77–5.28)
RDW: 11.4 % — ABNORMAL LOW (ref 11.7–15.4)
WBC: 5.1 10*3/uL (ref 3.4–10.8)

## 2022-11-23 LAB — LIPID PANEL
Chol/HDL Ratio: 3.7 {ratio} (ref 0.0–4.4)
Cholesterol, Total: 227 mg/dL — ABNORMAL HIGH (ref 100–199)
HDL: 62 mg/dL (ref >39)
LDL Chol Calc (NIH): 144 mg/dL — ABNORMAL HIGH (ref 0–99)
Triglycerides: 117 mg/dL (ref 0–149)
VLDL Cholesterol Cal: 21 mg/dL (ref 5–40)

## 2022-11-23 LAB — VITAMIN D 25 HYDROXY (VIT D DEFICIENCY, FRACTURES): Vit D, 25-Hydroxy: 47.6 ng/mL (ref 30.0–100.0)

## 2022-11-23 LAB — HEMOGLOBIN A1C
Est. average glucose Bld gHb Est-mCnc: 114 mg/dL
Hgb A1c MFr Bld: 5.6 % (ref 4.8–5.6)

## 2022-11-23 LAB — TSH: TSH: 0.922 u[IU]/mL (ref 0.450–4.500)

## 2022-11-23 MED ORDER — OMEPRAZOLE 40 MG PO CPDR: 40.0000 mg | DELAYED_RELEASE_CAPSULE | Freq: Every day | ORAL | 3 refills | Status: DC

## 2022-11-23 MED ORDER — VIVELLE-DOT 0.025 MG/24HR TD PTTW: 1.0000 | MEDICATED_PATCH | TRANSDERMAL | 3 refills | Status: DC

## 2022-11-23 NOTE — Progress Notes (Signed)
Teresa Sullivan,   A1C stable from 1 year ago.  Vitamin D normal range. Make sure taking at least 1000 units daily.  Thyroid looks great.  Hemoglobin normal.  Kidney function looks great.  ALT continues to be a little elevated. I would like to u/s of liver. Are you ok with this?   LDL not to goal but HDL looks amazing.   10 year risk is low and calcium score 0. Continue regular exercise and healthy diet. Will recheck in 1 year.   Marland Kitchen.The 10-year ASCVD risk score (Arnett DK, et al., 2019) is: 3.8%   Values used to calculate the score:     Age: 62 years     Sex: Female     Is Non-Hispanic African American: No     Diabetic: No     Tobacco smoker: No     Systolic Blood Pressure: 123 mmHg     Is BP treated: No     HDL Cholesterol: 62 mg/dL     Total Cholesterol: 227 mg/dL

## 2022-12-20 ENCOUNTER — Other Ambulatory Visit: Payer: Self-pay | Admitting: Physician Assistant

## 2022-12-20 DIAGNOSIS — Z79890 Hormone replacement therapy (postmenopausal): Secondary | ICD-10-CM

## 2022-12-28 ENCOUNTER — Telehealth: Payer: Self-pay

## 2022-12-28 NOTE — Telephone Encounter (Signed)
Task completed. Per patient, estradiol rx written as DAW on 11/24/22. Patient states that insurance will only cover the medication as generic. Contacted the pharmacy and verbal rx order submitted for generic. Pharmacy will prepare the rx for pick up. Patient has been updated.

## 2022-12-28 NOTE — Telephone Encounter (Signed)
Copied from CRM [id number]. Topic: Clinical - Medication Refill >> Dec 27, 2022  4:43 PM Clayton Bibles wrote: Most Recent Primary Care Visit:  Provider: Jomarie Longs  Department: [facility]  Visit Type: PHYSICAL  Date: 11/22/2022  Medication: VIVELLE-DOT 0.025 MG/ - She needs the generic brand so insurance will for med  Has the patient contacted their pharmacy? Yes (Agent: If no, request that the patient contact the pharmacy for the refill. If patient does not wish to contact the pharmacy document the reason why and proceed with request.) (Agent: If yes, when and what did the pharmacy advise?)  Is this the correct pharmacy for this prescription? Yes - [facility]  If no, delete pharmacy and type the correct one.  This is the patient's preferred pharmacy:  [id number] - [place], [place] - [address] [place] [address] Phone: [phone] Fax: [phone]  [facility] - [place] - [address] [place] [address] Phone: [phone] Fax: [phone]   Has the prescription been filled recently? No  Is the patient out of the medication? 2 patches left   Has the patient been seen for an appointment in the last year OR does the patient have an upcoming appointment? Yes  Can we respond through MyChart? Yes or call   Agent: Please be advised that Rx refills may take up to 3 business days. We ask that you follow-up with your pharmacy.

## 2022-12-30 ENCOUNTER — Ambulatory Visit
Admission: RE | Admit: 2022-12-30 | Discharge: 2022-12-30 | Disposition: A | Discharge status: Home / Self Care | Payer: 59 | Source: Ambulatory Visit | Attending: Physician Assistant | Admitting: Physician Assistant

## 2022-12-30 DIAGNOSIS — Z1231 Encounter for screening mammogram for malignant neoplasm of breast: Secondary | ICD-10-CM

## 2023-01-06 ENCOUNTER — Telehealth: Payer: Self-pay

## 2023-01-06 ENCOUNTER — Other Ambulatory Visit: Payer: Self-pay

## 2023-01-06 DIAGNOSIS — E039 Hypothyroidism, unspecified: Secondary | ICD-10-CM

## 2023-01-06 DIAGNOSIS — R7401 Elevation of levels of liver transaminase levels: Secondary | ICD-10-CM | POA: Insufficient documentation

## 2023-01-06 MED ORDER — LEVOTHYROXINE SODIUM 75 MCG PO TABS: 75.0000 ug | ORAL_TABLET | Freq: Every day | ORAL | 3 refills | Status: DC

## 2023-01-06 NOTE — Telephone Encounter (Signed)
Spoke with patient requesting rx rf of levothyroxine 75mg  Last written 01/07/2022 Last OV 11/22/2022 Last TSH lab work 11/22/2022 (0.922) No upcoming appt schld.

## 2023-01-06 NOTE — Progress Notes (Signed)
Normal mammogram. Follow up in one year.

## 2023-01-06 NOTE — Telephone Encounter (Signed)
Patient is agreeable to liver ultrasound but did not know how to respond in Mychart message.   Pended liver US order-

## 2023-01-19 ENCOUNTER — Ambulatory Visit (INDEPENDENT_AMBULATORY_CARE_PROVIDER_SITE_OTHER): Payer: 59

## 2023-01-19 DIAGNOSIS — R7401 Elevation of levels of liver transaminase levels: Secondary | ICD-10-CM

## 2023-01-20 ENCOUNTER — Encounter: Payer: Self-pay | Admitting: Physician Assistant

## 2023-01-20 DIAGNOSIS — K8020 Calculus of gallbladder without cholecystitis without obstruction: Secondary | ICD-10-CM | POA: Insufficient documentation

## 2023-01-20 DIAGNOSIS — K760 Fatty (change of) liver, not elsewhere classified: Secondary | ICD-10-CM | POA: Insufficient documentation

## 2023-01-20 NOTE — Progress Notes (Signed)
 U/S shows gallstones but no evidence of inflammation or infection or gallbladder. Evidence of fatty liver.   Fatty liver can cause elevated liver enzymes. Avoid any alcohol. Work on exercise 150 minutes a week. Eat lower glycemic index foods.

## 2023-04-17 ENCOUNTER — Other Ambulatory Visit: Payer: Self-pay | Admitting: Physician Assistant

## 2023-04-17 DIAGNOSIS — E039 Hypothyroidism, unspecified: Secondary | ICD-10-CM

## 2023-09-22 ENCOUNTER — Other Ambulatory Visit: Payer: Self-pay | Admitting: Physician Assistant

## 2023-09-22 DIAGNOSIS — Z1231 Encounter for screening mammogram for malignant neoplasm of breast: Secondary | ICD-10-CM

## 2023-11-13 ENCOUNTER — Other Ambulatory Visit: Payer: Self-pay | Admitting: Physician Assistant

## 2023-11-13 DIAGNOSIS — Z79890 Hormone replacement therapy (postmenopausal): Secondary | ICD-10-CM

## 2023-11-28 ENCOUNTER — Ambulatory Visit: Admitting: Physician Assistant

## 2023-11-28 ENCOUNTER — Encounter: Payer: Self-pay | Admitting: Physician Assistant

## 2023-11-28 VITALS — BP 124/69 | HR 64 | Ht 68.0 in | Wt 167.0 lb

## 2023-11-28 DIAGNOSIS — Z0000 Encounter for general adult medical examination without abnormal findings: Secondary | ICD-10-CM | POA: Diagnosis not present

## 2023-11-28 DIAGNOSIS — E559 Vitamin D deficiency, unspecified: Secondary | ICD-10-CM | POA: Diagnosis not present

## 2023-11-28 DIAGNOSIS — E039 Hypothyroidism, unspecified: Secondary | ICD-10-CM

## 2023-11-28 DIAGNOSIS — K760 Fatty (change of) liver, not elsewhere classified: Secondary | ICD-10-CM | POA: Diagnosis not present

## 2023-11-28 DIAGNOSIS — Z79890 Hormone replacement therapy (postmenopausal): Secondary | ICD-10-CM

## 2023-11-28 NOTE — Progress Notes (Unsigned)
 Complete physical exam  Patient: Teresa Sullivan   DOB: 12-Jan-1960   63 y.o. Female  MRN: [id number]  Subjective:    Chief Complaint  Patient presents with   Annual Exam    Fasting labs    [name] is a 63 y.o. female who presents today for a complete physical exam. She reports consuming a {diet types:17450} diet. {types:19826} She generally feels {DESC; WELL/FAIRLY WELL/POORLY:18703}. She reports sleeping {DESC; WELL/FAIRLY WELL/POORLY:18703}. She {does/does not:200015} have additional problems to discuss today.    Most recent fall risk assessment:    11/22/2022    9:07 AM  Fall Risk   Falls in the past year? 0  Number falls in past yr: 0  Injury with Fall? 0  Risk for fall due to : No Fall Risks     Most recent depression screenings:    11/22/2022    9:16 AM 11/10/2021    9:43 AM  PHQ 2/9 Scores  PHQ - 2 Score 0 0    {VISON DENTAL STD PSA (Optional):27386}  {History (Optional):23778}  Patient Care Team: Charon Akamine L, PA-C as PCP - General (Family Medicine) [place], Andrez SAUNDERS, PA-C (Inactive) as Physician Assistant (Dermatology)   Outpatient Medications Prior to Visit  Medication Sig   Emollient (ROC RETINOL CORREXION NIGHT) CREA Apply 1 application topically at bedtime.   estradiol  (VIVELLE -DOT) 0.025 MG/24HR PLACE 1 PATCH ON THE SKIN TWICE A WEEK   fluticasone  (FLONASE ) 50 MCG/ACT nasal spray Place 2 sprays into both nostrils daily.   levothyroxine  (SYNTHROID ) 75 MCG tablet TAKE 1 TABLET DAILY BEFORE BREAKFAST   triamcinolone  cream (KENALOG ) 0.1 % Apply 1 Application topically 2 (two) times daily.   Vitamin D , Ergocalciferol , (DRISDOL ) 1.25 MG (50000 UNIT) CAPS capsule Take 1 capsule (50,000 Units total) by mouth every 7 (seven) days. Take for 8 total doses(weeks)   [DISCONTINUED] omeprazole  (PRILOSEC) 40 MG capsule Take 1 capsule (40 mg total) by mouth daily.   No facility-administered medications prior to visit.    ROS         Objective:     BP 124/69   Pulse 64   Ht 5' 8 (1.727 m)   Wt 167 lb (75.8 kg)   SpO2 99%   BMI 25.39 kg/m  {Vitals History (Optional):23777}  Physical Exam   No results found for any visits on 11/28/23. {Show previous labs (optional):23779}    Assessment & Plan:    Routine Health Maintenance and Physical Exam  Immunization History  Administered Date(s) Administered   Influenza-Unspecified 11/07/2023   PFIZER(Purple Top)SARS-COV-2 Vaccination 04/22/2019, 05/14/2019   Tdap 11/10/2021   Zoster Recombinant(Shingrix ) 11/16/2018, 01/18/2019    Health Maintenance  Topic Date Due   COVID-19 Vaccine (3 - 2025-26 season) 12/14/2023 (Originally 09/11/2023)   Pneumococcal Vaccine: 50+ Years (1 of 1 - PCV) 11/27/2024 (Originally 01/05/2010)   Mammogram  12/29/2024   DTaP/Tdap/Td (2 - Td or Tdap) 11/11/2031   Colonoscopy  02/15/2032   Influenza Vaccine  Completed   Hepatitis C Screening  Completed   HIV Screening  Completed   Zoster Vaccines- Shingrix   Completed   Hepatitis B Vaccines 19-59 Average Risk  Aged Out   HPV VACCINES  Aged Out   Meningococcal B Vaccine  Aged Out    Discussed health benefits of physical activity, and encouraged her to engage in regular exercise appropriate for her age and condition.  Problem List Items Addressed This Visit       Unprioritized   Hypothyroidism  Relevant Orders   TSH + free T4   Hormone replacement therapy (postmenopausal)   Relevant Orders   Estradiol    Non-alcoholic fatty liver disease   Relevant Orders   CMP14+EGFR   Lipid panel   Vitamin D  deficiency   Relevant Orders   VITAMIN D  25 Hydroxy (Vit-D Deficiency, Fractures)   Other Visit Diagnoses       Routine physical examination    -  Primary   Relevant Orders   CBC with Differential/Platelet   CMP14+EGFR   Lipid panel   VITAMIN D  25 Hydroxy (Vit-D Deficiency, Fractures)   Vitamin B12   TSH + free T4   Estradiol       Return in about 1 year (around  11/27/2024).     Jasmon Mattice, PA-C

## 2023-11-28 NOTE — Patient Instructions (Signed)

## 2023-11-29 ENCOUNTER — Encounter: Payer: Self-pay | Admitting: Physician Assistant

## 2023-11-29 ENCOUNTER — Ambulatory Visit: Payer: Self-pay | Admitting: Physician Assistant

## 2023-11-29 DIAGNOSIS — E7800 Pure hypercholesterolemia, unspecified: Secondary | ICD-10-CM

## 2023-11-29 LAB — VITAMIN D 25 HYDROXY (VIT D DEFICIENCY, FRACTURES): Vit D, 25-Hydroxy: 45.3 ng/mL (ref 30.0–100.0)

## 2023-11-29 LAB — CBC WITH DIFFERENTIAL/PLATELET
Basophils Absolute: 0.1 x10E3/uL (ref 0.0–0.2)
Basos: 1 %
EOS (ABSOLUTE): 0.1 x10E3/uL (ref 0.0–0.4)
Eos: 2 %
Hematocrit: 41.1 % (ref 34.0–46.6)
Hemoglobin: 13.9 g/dL (ref 11.1–15.9)
Immature Grans (Abs): 0 x10E3/uL (ref 0.0–0.1)
Immature Granulocytes: 0 %
Lymphocytes Absolute: 1.3 x10E3/uL (ref 0.7–3.1)
Lymphs: 24 %
MCH: 31.6 pg (ref 26.6–33.0)
MCHC: 33.8 g/dL (ref 31.5–35.7)
MCV: 93 fL (ref 79–97)
Monocytes Absolute: 0.4 x10E3/uL (ref 0.1–0.9)
Monocytes: 8 %
Neutrophils Absolute: 3.5 x10E3/uL (ref 1.4–7.0)
Neutrophils: 65 %
Platelets: 289 x10E3/uL (ref 150–450)
RBC: 4.4 x10E6/uL (ref 3.77–5.28)
RDW: 11.4 % — ABNORMAL LOW (ref 11.7–15.4)
WBC: 5.3 x10E3/uL (ref 3.4–10.8)

## 2023-11-29 LAB — VITAMIN B12: Vitamin B-12: 595 pg/mL (ref 232–1245)

## 2023-11-29 LAB — TSH+FREE T4
Free T4: 1.57 ng/dL (ref 0.82–1.77)
TSH: 0.656 u[IU]/mL (ref 0.450–4.500)

## 2023-11-29 LAB — CMP14+EGFR
ALT: 30 IU/L (ref 0–32)
AST: 25 IU/L (ref 0–40)
Albumin: 4.6 g/dL (ref 3.9–4.9)
Alkaline Phosphatase: 77 IU/L (ref 49–135)
BUN/Creatinine Ratio: 18 (ref 12–28)
BUN: 15 mg/dL (ref 8–27)
Bilirubin Total: 2.1 mg/dL — ABNORMAL HIGH (ref 0.0–1.2)
CO2: 21 mmol/L (ref 20–29)
Calcium: 9.7 mg/dL (ref 8.7–10.3)
Chloride: 101 mmol/L (ref 96–106)
Creatinine, Ser: 0.83 mg/dL (ref 0.57–1.00)
Globulin, Total: 2.3 g/dL (ref 1.5–4.5)
Glucose: 99 mg/dL (ref 70–99)
Potassium: 4.6 mmol/L (ref 3.5–5.2)
Sodium: 143 mmol/L (ref 134–144)
Total Protein: 6.9 g/dL (ref 6.0–8.5)
eGFR: 79 mL/min/1.73 (ref >59)

## 2023-11-29 LAB — LIPID PANEL
Chol/HDL Ratio: 3.4 ratio (ref 0.0–4.4)
Cholesterol, Total: 210 mg/dL — ABNORMAL HIGH (ref 100–199)
HDL: 62 mg/dL (ref >39)
LDL Chol Calc (NIH): 128 mg/dL — ABNORMAL HIGH (ref 0–99)
Triglycerides: 111 mg/dL (ref 0–149)
VLDL Cholesterol Cal: 20 mg/dL (ref 5–40)

## 2023-11-29 LAB — ESTRADIOL: Estradiol: 44 pg/mL (ref 0.0–54.7)

## 2023-11-29 NOTE — Progress Notes (Signed)
 [place],   Estradiol  looks good.  Thyroid looks great.  B12 normal.  Vitamin D  normal range.  Kidney and liver look good.  HDL, good cholesterol, looks great.  LDL bad cholesterol improved some but not to optimal goal. Overall 10 year risk is still under 7.5 percent so can continue working on diet and exercise.   SABRA.The 10-year ASCVD risk score (Arnett DK, et al., 2019) is: 4.1%   Values used to calculate the score:     Age: 63 years     Clincally relevant sex: Female     Is Non-Hispanic African American: No     Diabetic: No     Tobacco smoker: No     Systolic Blood Pressure: 124 mmHg     Is BP treated: No     HDL Cholesterol: 62 mg/dL     Total Cholesterol: 210 mg/dL

## 2024-01-01 ENCOUNTER — Ambulatory Visit

## 2024-01-01 DIAGNOSIS — Z1231 Encounter for screening mammogram for malignant neoplasm of breast: Secondary | ICD-10-CM

## 2024-01-08 ENCOUNTER — Ambulatory Visit: Payer: Self-pay | Admitting: Physician Assistant

## 2024-01-08 NOTE — Progress Notes (Signed)
 Normal mammogram. Follow up in one year.

## 2024-01-31 ENCOUNTER — Ambulatory Visit: Admitting: Urgent Care

## 2024-02-02 ENCOUNTER — Ambulatory Visit: Admitting: Physician Assistant

## 2024-02-02 ENCOUNTER — Encounter: Payer: Self-pay | Admitting: Physician Assistant

## 2024-02-02 VITALS — BP 135/83 | HR 86 | Temp 98.3°F | Ht 68.0 in | Wt 170.0 lb

## 2024-02-02 DIAGNOSIS — J0140 Acute pansinusitis, unspecified: Secondary | ICD-10-CM | POA: Diagnosis not present

## 2024-02-02 DIAGNOSIS — Z8669 Personal history of other diseases of the nervous system and sense organs: Secondary | ICD-10-CM

## 2024-02-02 DIAGNOSIS — J029 Acute pharyngitis, unspecified: Secondary | ICD-10-CM | POA: Diagnosis not present

## 2024-02-02 DIAGNOSIS — J310 Chronic rhinitis: Secondary | ICD-10-CM | POA: Diagnosis not present

## 2024-02-02 LAB — POCT RAPID STREP A (OFFICE): Rapid Strep A Screen: NEGATIVE

## 2024-02-02 MED ORDER — AMOXICILLIN-POT CLAVULANATE 875-125 MG PO TABS: 1.0000 | ORAL_TABLET | Freq: Two times a day (BID) | ORAL | 0 refills | Status: AC

## 2024-02-02 MED ORDER — FLUTICASONE PROPIONATE 50 MCG/ACT NA SUSP: 2.0000 | Freq: Every day | NASAL | 3 refills | Status: AC

## 2024-02-02 NOTE — Progress Notes (Signed)
 "  Acute Office Visit  Subjective:     Patient ID: Teresa Sullivan, female    DOB: 05-01-1960, 64 y.o.   MRN: [id number]  Chief Complaint  Patient presents with   Sore Throat    HPI .Discussed the use of AI scribe software for clinical note transcription with the patient, who gave verbal consent to proceed.  History of Present Illness Teresa Sullivan is a 64 year old female who presents with persistent headache, sore throat, and ear pressure for the last 2 weeks.   Headache and systemic symptoms - Persistent headache for approximately two weeks - Fever around 100F at onset - Symptoms improve in the morning and recur by evening  Oropharyngeal symptoms - Sore throat, worse in the evening - Dry cough present - Occasional cough productive of mucus, less frequent than before - No shortness of breath or trouble breathing - Takes one Advil at night for sore throat - Uses Mucinex for symptom management  Otologic symptoms - Ear pressure and fullness, particularly in the morning - Ears feel thick in the morning - Clear ear discharge, occasionally with a small amount of blood  Ocular symptoms - Green and yellow ocular discharge noted about one week ago - Prescribed antibiotic and steroid eye drops by eye doctor - Ocular discharge was yellow and green in color    ROS See HPI.      Objective:    BP 135/83   Pulse 86   Temp 98.3 F (36.8 C) (Oral)   Ht 5' 8 (1.727 m)   Wt 170 lb (77.1 kg)   SpO2 99%   BMI 25.85 kg/m  BP Readings from Last 3 Encounters:  02/02/24 135/83  11/28/23 124/69  11/22/22 123/63   Wt Readings from Last 3 Encounters:  02/02/24 170 lb (77.1 kg)  11/28/23 167 lb (75.8 kg)  11/22/22 173 lb 12 oz (78.8 kg)    . Results for orders placed or performed in visit on 02/02/24  POCT rapid strep A   Collection Time: 02/02/24  1:15 PM  Result Value Ref Range   Rapid Strep A Screen Negative Negative     Physical Exam Constitutional:       Appearance: She is well-developed.  HENT:     Head: Normocephalic.     Right Ear: Tympanic membrane normal. No drainage, swelling or tenderness. No middle ear effusion. Tympanic membrane is not erythematous.     Left Ear: Tympanic membrane normal. No drainage, swelling or tenderness.  No middle ear effusion. Tympanic membrane is not erythematous.     Mouth/Throat:     Mouth: Mucous membranes are moist.     Pharynx: Uvula midline. Posterior oropharyngeal erythema and uvula swelling present. No pharyngeal swelling or oropharyngeal exudate.     Tonsils: No tonsillar exudate or tonsillar abscesses. 0 on the right. 0 on the left.  Cardiovascular:     Rate and Rhythm: Normal rate and regular rhythm.  Pulmonary:     Effort: Pulmonary effort is normal.     Breath sounds: Normal breath sounds.  Abdominal:     Tenderness: There is no rebound.  Lymphadenopathy:     Cervical: Cervical adenopathy present.  Neurological:     General: No focal deficit present.     Mental Status: She is alert and oriented to person, place, and time.  Psychiatric:        Mood and Affect: Mood normal.     Results for orders placed or performed in visit  on 02/02/24  POCT rapid strep A  Result Value Ref Range   Rapid Strep A Screen Negative Negative        Assessment & Plan:  .Shron was seen today for sore throat.  Diagnoses and all orders for this visit:  Acute non-recurrent pansinusitis -     amoxicillin -clavulanate (AUGMENTIN ) 875-125 MG tablet; Take 1 tablet by mouth 2 (two) times daily. -     fluticasone  (FLONASE ) 50 MCG/ACT nasal spray; Place 2 sprays into both nostrils daily.  Sore throat -     POCT rapid strep A  History of bacterial conjunctivitis -     amoxicillin -clavulanate (AUGMENTIN ) 875-125 MG tablet; Take 1 tablet by mouth 2 (two) times daily.  Chronic rhinitis -     fluticasone  (FLONASE ) 50 MCG/ACT nasal spray; Place 2 sprays into both nostrils daily.    Assessment &  Plan Acute sinusitis/ST/recent history of bacterial conjunctivitis Rapid strep negative.  Symptoms suggest sinus infection with persistent drainage and mucus accumulation. - Prescribed Augmentin  for 10 days.  - Use flonase  to help with nasal congestion and drainage and ear pressure.  - follow up as needed if symptoms persist or worsen.    Return if symptoms worsen or fail to improve.  Colton Tassin, PA-C   "

## 2024-02-02 NOTE — Patient Instructions (Signed)

## 2024-11-27 ENCOUNTER — Encounter: Admitting: Physician Assistant
